# Patient Record
Sex: Female | Born: 1985 | Race: White | Hispanic: No | Marital: Married | Smoking: Current every day smoker
Health system: Southern US, Community
[De-identification: ages and names within clinical notes are randomized; demographics above are authoritative.]

## PROBLEM LIST (undated history)

## (undated) ENCOUNTER — Inpatient Hospital Stay (HOSPITAL_COMMUNITY): Payer: Self-pay

## (undated) DIAGNOSIS — F1910 Other psychoactive substance abuse, uncomplicated: Secondary | ICD-10-CM

## (undated) DIAGNOSIS — F32A Depression, unspecified: Secondary | ICD-10-CM

## (undated) DIAGNOSIS — F419 Anxiety disorder, unspecified: Secondary | ICD-10-CM

## (undated) HISTORY — DX: Other psychoactive substance abuse, uncomplicated: F19.10

## (undated) HISTORY — DX: Depression, unspecified: F32.A

## (undated) HISTORY — DX: Anxiety disorder, unspecified: F41.9

---
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
## 2017-11-27 ENCOUNTER — Encounter (HOSPITAL_COMMUNITY): Payer: Self-pay | Admitting: Emergency Medicine

## 2017-11-27 ENCOUNTER — Emergency Department (HOSPITAL_COMMUNITY)
Admission: EM | Admit: 2017-11-27 | Discharge: 2017-11-27 | Disposition: A | Discharge status: Home / Self Care | Payer: Self-pay | Attending: Emergency Medicine | Admitting: Emergency Medicine

## 2017-11-27 ENCOUNTER — Other Ambulatory Visit: Payer: Self-pay

## 2017-11-27 DIAGNOSIS — Y929 Unspecified place or not applicable: Secondary | ICD-10-CM | POA: Insufficient documentation

## 2017-11-27 DIAGNOSIS — Y999 Unspecified external cause status: Secondary | ICD-10-CM | POA: Insufficient documentation

## 2017-11-27 DIAGNOSIS — F17210 Nicotine dependence, cigarettes, uncomplicated: Secondary | ICD-10-CM | POA: Insufficient documentation

## 2017-11-27 DIAGNOSIS — X58XXXA Exposure to other specified factors, initial encounter: Secondary | ICD-10-CM | POA: Insufficient documentation

## 2017-11-27 DIAGNOSIS — S29011D Strain of muscle and tendon of front wall of thorax, subsequent encounter: Secondary | ICD-10-CM

## 2017-11-27 DIAGNOSIS — Y939 Activity, unspecified: Secondary | ICD-10-CM | POA: Insufficient documentation

## 2017-11-27 DIAGNOSIS — S29011A Strain of muscle and tendon of front wall of thorax, initial encounter: Secondary | ICD-10-CM | POA: Insufficient documentation

## 2017-11-27 MED ORDER — ACETAMINOPHEN 500 MG PO TABS: 500.0000 mg | ORAL_TABLET | Freq: Four times a day (QID) | ORAL | 0 refills | Status: DC | PRN

## 2017-11-27 MED ORDER — METHOCARBAMOL 500 MG PO TABS: 500.0000 mg | ORAL_TABLET | Freq: Every evening | ORAL | 0 refills | Status: DC | PRN

## 2017-11-27 MED ORDER — IBUPROFEN 600 MG PO TABS: 600.0000 mg | ORAL_TABLET | Freq: Four times a day (QID) | ORAL | 0 refills | Status: DC | PRN

## 2017-11-27 NOTE — ED Notes (Signed)
Respiratory called

## 2017-11-27 NOTE — ED Provider Notes (Signed)
[facility] EMERGENCY DEPARTMENT Provider Note   CSN: [id number] Arrival date & time: 11/27/17  1601     History   Chief Complaint Chief Complaint  Patient presents with  . Rib cage pain    HPI Lisa Pierce is a 32 y.o. female presenting with 3 weeks of left rib pain.  Denies any direct injury or trauma but reports coughing forcefully and starting to experience the pain thereafter.  The pain is worse with deep breaths and tender to the touch. She has tried ibuprofen without relief. Denies rash, cough, fever, chills or other symptoms.  No history of DVT/PE, prolonged immobilization, recent travel, lower extremity edema or pain, estrogen use, malignancy. No chest pain otherwise, shortness of breath.  HPI  History reviewed. No pertinent past medical history.  There are no active problems to display for this patient.   History reviewed. No pertinent surgical history.   OB History   None      Home Medications    Prior to Admission medications   Medication Sig Start Date End Date Taking? Authorizing Provider  acetaminophen (TYLENOL) 500 MG tablet Take 1 tablet (500 mg total) by mouth every 6 (six) hours as needed for mild pain or moderate pain. 11/27/17   Georgiana Shore, PA-C  ibuprofen (ADVIL,MOTRIN) 600 MG tablet Take 1 tablet (600 mg total) by mouth every 6 (six) hours as needed. 11/27/17   Georgiana Shore, PA-C  methocarbamol (ROBAXIN) 500 MG tablet Take 1 tablet (500 mg total) by mouth at bedtime as needed. 11/27/17   Georgiana Shore, PA-C    Family History No family history on file.  Social History Social History   Tobacco Use  . Smoking status: Current Every Day Smoker    Packs/day: 0.50    Types: Cigarettes  . Smokeless tobacco: Never Used  Substance Use Topics  . Alcohol use: Not Currently    Frequency: Never  . Drug use: Never     Allergies   Patient has no known allergies.   Review of Systems Review of Systems  Constitutional:  Negative for chills, fatigue and fever.  HENT: Negative for congestion.   Respiratory: Negative for cough, choking, chest tightness, shortness of breath, wheezing and stridor.   Cardiovascular: Negative for chest pain, palpitations and leg swelling.       Chest wall tenderness on the left side  Gastrointestinal: Negative for abdominal pain, nausea and vomiting.  Musculoskeletal: Negative for myalgias, neck pain and neck stiffness.  Skin: Negative for color change, pallor and rash.  Neurological: Negative for dizziness, weakness, light-headedness, numbness and headaches.     Physical Exam Updated Vital Signs BP 128/79 (BP Location: Right Arm)   Pulse (!) 55   Temp 98.3 F (36.8 C) (Oral)   Resp 18   Ht  (1.676 m)   Wt 54 kg (119 lb)   LMP  (LMP Unknown)   SpO2 100%   BMI 19.21 kg/m   Physical Exam  Constitutional: She appears well-developed and well-nourished. No distress.  Well-appearing, afebrile nontoxic sitting comfortably in bed no acute distress.  HENT:  Head: Normocephalic and atraumatic.  Eyes: Conjunctivae and EOM are normal. Right eye exhibits no discharge. Left eye exhibits no discharge.  Neck: Normal range of motion. Neck supple.  Cardiovascular: Normal rate, regular rhythm, normal heart sounds and intact distal pulses.  No murmur heard. Pulmonary/Chest: Effort normal and breath sounds normal. No stridor. No respiratory distress. She has no wheezes. She has no rales.  She exhibits tenderness.  Abdominal: She exhibits no distension.  Musculoskeletal: Normal range of motion. She exhibits no edema, tenderness or deformity.  Neurological: She is alert.  Skin: Skin is warm and dry. No rash noted. She is not diaphoretic. No erythema. No pallor.  Psychiatric: She has a normal mood and affect.  Nursing note and vitals reviewed.    ED Treatments / Results  Labs (all labs ordered are listed, but only abnormal results are displayed) Labs Reviewed - No data to  display  EKG None  Radiology No results found.  Procedures Procedures (including critical care time)  Medications Ordered in ED Medications - No data to display   Initial Impression / Assessment and Plan / ED Course  I have reviewed the triage vital signs and the nursing notes.  Pertinent labs & imaging results that were available during my care of the patient were reviewed by me and considered in my medical decision making (see chart for details).     Otherwise healthy 32 year old female presents with 3 weeks of left rib pain after forceful coughing 3 weeks ago.  Patient is without cough, fever or other symptoms.  Negative chest xray at UC earlier today. Tender to palpation along the left anterior 6th rib and 5-6th intercostal.  Low suspicion for PE as etiology of patient's symptoms. PERC negative. O2 sats 100% on room air, HR55, no SOB.  Patient's history and physical exam findings consistent with musculoskeletal etiology. Will treat symptomatically with anti-inflammatories and muscle relaxer.  She is well-appearing nontoxic afebrile. Discharge home with symptomatic relief and close follow-up with PCP.  Discussed strict return precautions and patient understood and agreed with plan.  Final Clinical Impressions(s) / ED Diagnoses   Final diagnoses:  Intercostal muscle strain, subsequent encounter    ED Discharge Orders        Ordered    methocarbamol (ROBAXIN) 500 MG tablet  At bedtime PRN     11/27/17 1727    ibuprofen (ADVIL,MOTRIN) 600 MG tablet  Every 6 hours PRN     11/27/17 1727    acetaminophen (TYLENOL) 500 MG tablet  Every 6 hours PRN     11/27/17 1727       Gregary Cromer 11/27/17 1811    Eber Hong, MD 11/29/17 [phone]

## 2017-11-27 NOTE — ED Notes (Signed)
resp in to assess

## 2017-11-27 NOTE — ED Triage Notes (Addendum)
Pt c/o cough and pain to LT rib cage x 1 month. States she was evaluated at UC x 2 including today. Per pt, xray was negative for any fracture or pneumonia.

## 2017-11-27 NOTE — Discharge Instructions (Signed)
Discussed, make sure that you continue to take full breaths, alternate between ibuprofen every 6 hours and Tylenol every 6 hours.  The medication prescribed can help with muscle spasm but should not be taken if working, with alcohol or driving.  Take at nighttime or every 8 hours as needed. Continue with topical muscle creams.  Follow-up with your primary care provider if symptoms persist beyond the week. Return sooner if symptoms worsen, chest pain, difficulty breathing, fever, chills or other new concerning symptoms in the meantime.

## 2020-02-13 LAB — HM PAP SMEAR: HM Pap smear: HIGH

## 2020-11-10 ENCOUNTER — Emergency Department (HOSPITAL_COMMUNITY)
Admission: EM | Admit: 2020-11-10 | Discharge: 2020-11-10 | Discharge status: Against Medical Advice | Payer: 59 | Attending: Emergency Medicine | Admitting: Emergency Medicine

## 2020-11-10 ENCOUNTER — Other Ambulatory Visit: Payer: Self-pay

## 2020-11-10 ENCOUNTER — Encounter (HOSPITAL_COMMUNITY): Payer: Self-pay | Admitting: Emergency Medicine

## 2020-11-10 DIAGNOSIS — R1013 Epigastric pain: Secondary | ICD-10-CM | POA: Diagnosis present

## 2020-11-10 DIAGNOSIS — R112 Nausea with vomiting, unspecified: Secondary | ICD-10-CM | POA: Insufficient documentation

## 2020-11-10 DIAGNOSIS — F17210 Nicotine dependence, cigarettes, uncomplicated: Secondary | ICD-10-CM | POA: Insufficient documentation

## 2020-11-10 DIAGNOSIS — Z3202 Encounter for pregnancy test, result negative: Secondary | ICD-10-CM | POA: Insufficient documentation

## 2020-11-10 LAB — CBC WITH DIFFERENTIAL/PLATELET
Abs Immature Granulocytes: 0.01 10*3/uL (ref 0.00–0.07)
Basophils Absolute: 0 10*3/uL (ref 0.0–0.1)
Basophils Relative: 0 %
Eosinophils Absolute: 0.1 10*3/uL (ref 0.0–0.5)
Eosinophils Relative: 1 %
HCT: 39 % (ref 36.0–46.0)
Hemoglobin: 13.2 g/dL (ref 12.0–15.0)
Immature Granulocytes: 0 %
Lymphocytes Relative: 19 %
Lymphs Abs: 1.1 10*3/uL (ref 0.7–4.0)
MCH: 32.5 pg (ref 26.0–34.0)
MCHC: 33.8 g/dL (ref 30.0–36.0)
MCV: 96.1 fL (ref 80.0–100.0)
Monocytes Absolute: 0.5 10*3/uL (ref 0.1–1.0)
Monocytes Relative: 9 %
Neutro Abs: 4 10*3/uL (ref 1.7–7.7)
Neutrophils Relative %: 71 %
Platelets: 322 10*3/uL (ref 150–400)
RBC: 4.06 MIL/uL (ref 3.87–5.11)
RDW: 12 % (ref 11.5–15.5)
WBC: 5.7 10*3/uL (ref 4.0–10.5)
nRBC: 0 % (ref 0.0–0.2)

## 2020-11-10 LAB — URINALYSIS, ROUTINE W REFLEX MICROSCOPIC
Bilirubin Urine: NEGATIVE
Glucose, UA: NEGATIVE mg/dL
Hgb urine dipstick: NEGATIVE
Ketones, ur: NEGATIVE mg/dL
Leukocytes,Ua: NEGATIVE
Nitrite: NEGATIVE
Protein, ur: NEGATIVE mg/dL
Specific Gravity, Urine: 1.015 (ref 1.005–1.030)
pH: 6.5 (ref 5.0–8.0)

## 2020-11-10 LAB — RAPID URINE DRUG SCREEN, HOSP PERFORMED
Amphetamines: NOT DETECTED
Barbiturates: NOT DETECTED
Benzodiazepines: NOT DETECTED
Cocaine: NOT DETECTED
Opiates: NOT DETECTED
Tetrahydrocannabinol: POSITIVE — AB

## 2020-11-10 LAB — COMPREHENSIVE METABOLIC PANEL
ALT: 15 U/L (ref 0–44)
AST: 22 U/L (ref 15–41)
Albumin: 3.9 g/dL (ref 3.5–5.0)
Alkaline Phosphatase: 45 U/L (ref 38–126)
Anion gap: 8 (ref 5–15)
BUN: 14 mg/dL (ref 6–20)
CO2: 24 mmol/L (ref 22–32)
Calcium: 8.7 mg/dL — ABNORMAL LOW (ref 8.9–10.3)
Chloride: 106 mmol/L (ref 98–111)
Creatinine, Ser: 0.71 mg/dL (ref 0.44–1.00)
GFR, Estimated: >60 mL/min (ref >60)
Glucose, Bld: 96 mg/dL (ref 70–99)
Potassium: 4.2 mmol/L (ref 3.5–5.1)
Sodium: 138 mmol/L (ref 135–145)
Total Bilirubin: 0.5 mg/dL (ref 0.3–1.2)
Total Protein: 6.6 g/dL (ref 6.5–8.1)

## 2020-11-10 LAB — PREGNANCY, URINE: Preg Test, Ur: NEGATIVE

## 2020-11-10 LAB — LIPASE, BLOOD: Lipase: 40 U/L (ref 11–51)

## 2020-11-10 MED ORDER — SODIUM CHLORIDE 0.9 % IV BOLUS
1000.0000 mL | Freq: Once | INTRAVENOUS | Status: AC
  Administered 2020-11-10: 1000 mL via INTRAVENOUS

## 2020-11-10 MED ORDER — ONDANSETRON HCL 4 MG/2ML IJ SOLN
4.0000 mg | Freq: Once | INTRAMUSCULAR | Status: AC
  Administered 2020-11-10: 4 mg via INTRAVENOUS
  Filled 2020-11-10: qty 2

## 2020-11-10 MED ORDER — FAMOTIDINE IN NACL 20-0.9 MG/50ML-% IV SOLN
20.0000 mg | Freq: Once | INTRAVENOUS | Status: AC
  Administered 2020-11-10: 20 mg via INTRAVENOUS
  Filled 2020-11-10: qty 50

## 2020-11-10 MED ORDER — KETOROLAC TROMETHAMINE 30 MG/ML IJ SOLN
30.0000 mg | Freq: Once | INTRAMUSCULAR | Status: AC
  Administered 2020-11-10: 30 mg via INTRAVENOUS
  Filled 2020-11-10: qty 1

## 2020-11-10 NOTE — TOC Progression Note (Signed)
Transition of Care [facility]) - Progression Note    Patient Details  Name: Lisa Pierce MRN: [id number] Date of Birth: [birth year]  Transition of Care [facility]) CM/SW Contact  Karn Cassis, [place] Phone Number: 11/10/2020, 1:24 PM  Clinical Narrative:  TOC notified that pt requesting substance abuse resources. Attempted to take resources to pt, but pt had already left ED. LCSW left voicemail for pt requesting return call to discuss.            Expected Discharge Plan and Services                                                 Social Determinants of Health (SDOH) Interventions    Readmission Risk Interventions No flowsheet data found.

## 2020-11-10 NOTE — ED Provider Notes (Signed)
[facility] EMERGENCY DEPARTMENT Provider Note   CSN: [id number] Arrival date & time: 11/10/20  0654     History Chief Complaint  Patient presents with  . Abdominal Pain    IVORIE UPLINGER is a 35 y.o. female.  HPI   35 year old female presents the emergency department concern for epigastric abdominal pain with n/v.  Patient states this began without past couple days.  Patient admits that she drinks about a gallon of whiskey every 3 to 4 days, she is been doing this for the past year.  She has never tried to detox or had withdrawal before.  She states she is had symptoms similar to present in the past but it is never lasted this long or been this severe.  No history of surgeries in the abdomen, no previous problems with her gallbladder or pancreas. She did have blood streaks in her last emesis, no clots. She has a Mirena in place and does not believe that she could be pregnant.  Denies any fever, diarrhea, genitourinary symptoms.  History reviewed. No pertinent past medical history.  There are no problems to display for this patient.   History reviewed. No pertinent surgical history.   OB History   No obstetric history on file.     History reviewed. No pertinent family history.  Social History   Tobacco Use  . Smoking status: Current Every Day Smoker    Packs/day: 0.50    Types: Cigarettes  . Smokeless tobacco: Never Used  Vaping Use  . Vaping Use: Never used  Substance Use Topics  . Alcohol use: Yes    Comment: 1 gallon of whiskey ever 3-4 days  . Drug use: Never    Home Medications Prior to Admission medications   Medication Sig Start Date End Date Taking? Authorizing Provider  acetaminophen (TYLENOL) 500 MG tablet Take 1 tablet (500 mg total) by mouth every 6 (six) hours as needed for mild pain or moderate pain. Patient not taking: Reported on 11/10/2020 11/27/17   Georgiana Shore, PA-C  ibuprofen (ADVIL,MOTRIN) 600 MG tablet Take 1 tablet (600 mg total) by  mouth every 6 (six) hours as needed. Patient not taking: Reported on 11/10/2020 11/27/17   Georgiana Shore, PA-C  methocarbamol (ROBAXIN) 500 MG tablet Take 1 tablet (500 mg total) by mouth at bedtime as needed. Patient not taking: Reported on 11/10/2020 11/27/17   Georgiana Shore, PA-C    Allergies    Patient has no known allergies.  Review of Systems   Review of Systems  Constitutional: Negative for chills and fever.  HENT: Negative for congestion.   Eyes: Negative for visual disturbance.  Respiratory: Negative for shortness of breath.   Cardiovascular: Negative for chest pain.  Gastrointestinal: Positive for abdominal pain, nausea and vomiting. Negative for diarrhea.  Genitourinary: Negative for dysuria.  Skin: Negative for rash.  Neurological: Negative for headaches.  Psychiatric/Behavioral: Negative for self-injury and suicidal ideas. The patient is nervous/anxious.     Physical Exam Updated Vital Signs BP (!) 131/98   Pulse 80   Temp 98.2 F (36.8 C) (Oral)   Resp 18   Ht 5\' 6"  (1.676 m)   Wt 67.6 kg   SpO2 98%   BMI 24.05 kg/m   Physical Exam Vitals and nursing note reviewed.  Constitutional:      Appearance: Normal appearance.  HENT:     Head: Normocephalic.     Mouth/Throat:     Mouth: Mucous membranes are moist.  Cardiovascular:  Rate and Rhythm: Normal rate.  Pulmonary:     Effort: Pulmonary effort is normal. No respiratory distress.  Abdominal:     Palpations: Abdomen is soft.     Tenderness: There is abdominal tenderness in the epigastric area. There is no guarding or rebound.  Skin:    General: Skin is warm.  Neurological:     Mental Status: She is alert and oriented to person, place, and time. Mental status is at baseline.  Psychiatric:        Mood and Affect: Mood is anxious.     ED Results / Procedures / Treatments   Labs (all labs ordered are listed, but only abnormal results are displayed) Labs Reviewed  CBC WITH  DIFFERENTIAL/PLATELET  COMPREHENSIVE METABOLIC PANEL  LIPASE, BLOOD  PREGNANCY, URINE  URINALYSIS, ROUTINE W REFLEX MICROSCOPIC  RAPID URINE DRUG SCREEN, HOSP PERFORMED    EKG None  Radiology No results found.  Procedures Procedures   Medications Ordered in ED Medications  famotidine (PEPCID) IVPB 20 mg premix (20 mg Intravenous New Bag/Given 11/10/20 0834)  sodium chloride 0.9 % bolus 1,000 mL (1,000 mLs Intravenous New Bag/Given 11/10/20 0827)  ondansetron (ZOFRAN) injection 4 mg (4 mg Intravenous Given 11/10/20 0829)  ketorolac (TORADOL) 30 MG/ML injection 30 mg (30 mg Intravenous Given 11/10/20 0830)    ED Course  I have reviewed the triage vital signs and the nursing notes.  Pertinent labs & imaging results that were available during my care of the patient were reviewed by me and considered in my medical decision making (see chart for details).    MDM Rules/Calculators/A&P                          Vitals are stable.  Lab evaluation is normal, after medication patient states her symptoms are significantly improved, she been able to eat and drink.  I put in a consult with social work to speak to the patient about her substance abuse and options for detox/outpatient care and follow-up.  However the patient eloped from the emergency department before social work can provide this information to her.  Report from nursing is that she said she had to go home to take care of her kids and left with a steady gait.  On my last evaluation she was comfortable in bed, no acute distress, abdominal exam was benign, vitals were normal.  Final Clinical Impression(s) / ED Diagnoses Final diagnoses:  None    Rx / DC Orders ED Discharge Orders    None       Rozelle Logan, DO 11/10/20 1415

## 2020-11-10 NOTE — ED Triage Notes (Signed)
Pt c/o abd pain and vomiting blood that started x3 days ago. Pt states she drinks 1 gallon whiskey every 3-4 days.

## 2020-11-10 NOTE — ED Notes (Signed)
Pt left the dept.  Pt states she has kids at home.  Pt states she will get PCP soon and will get help. Pt left with with visitor and with steady gait.

## 2021-01-05 ENCOUNTER — Ambulatory Visit: Payer: 59 | Admitting: Nurse Practitioner

## 2021-01-05 ENCOUNTER — Other Ambulatory Visit: Payer: Self-pay

## 2021-01-05 ENCOUNTER — Encounter: Payer: Self-pay | Admitting: Nurse Practitioner

## 2021-01-05 VITALS — BP 135/88 | HR 85 | Temp 97.1°F | Ht 66.0 in | Wt 155.0 lb

## 2021-01-05 DIAGNOSIS — F10230 Alcohol dependence with withdrawal, uncomplicated: Secondary | ICD-10-CM | POA: Diagnosis not present

## 2021-01-05 DIAGNOSIS — F10930 Alcohol use, unspecified with withdrawal, uncomplicated: Secondary | ICD-10-CM | POA: Insufficient documentation

## 2021-01-05 MED ORDER — GABAPENTIN 300 MG PO CAPS: 300.0000 mg | ORAL_CAPSULE | Freq: Three times a day (TID) | ORAL | 0 refills | Status: DC

## 2021-01-05 NOTE — Patient Instructions (Addendum)
Gabapentin 300 mg 3 times a 3 times daily for 3 days, then 300 mg twice a day for 1 day.  Alcohol Withdrawal Syndrome When a person who drinks a lot of alcohol stops drinking, he or she may have unpleasant and serious symptoms. These symptoms are called alcohol withdrawal syndrome. This condition may be mild or severe. It can be life-threatening. It can cause:  Shaking that you cannot control (tremor).  Sweating.  Headache.  Feeling fearful, upset, grouchy, or depressed.  Trouble sleeping (insomnia).  Nightmares.  Fast or uneven heartbeats (palpitations).  Alcohol cravings.  Feeling sick to your stomach (nausea).  Throwing up (vomiting).  Being bothered by light and sounds.  Confusion.  Trouble thinking clearly.  Not being hungry (loss of appetite).  Big changes in mood (mood swings). If you have all of the following symptoms at the same time, get help right away:  High blood pressure.  Fast heartbeat.  Trouble breathing.  Seizures.  Seeing, hearing, feeling, smelling, or tasting things that are not there (hallucinations). These symptoms are known as delirium tremens (DTs). They must be treated at the hospital right away. Follow these instructions at home:  Take over-the-counter and prescription medicines only as told by your doctor. This includes vitamins.  Do not drink alcohol.  Do not drive until your doctor says that this is safe for you.  Have someone stay with you or be available in case you need help. This should be someone you trust. This person can help you with your symptoms. He or she can also help you to not drink.  Drink enough fluid to keep your pee (urine) pale yellow.  Think about joining a support group or a treatment program to help you stop drinking.  Keep all follow-up visits as told by your doctor. This is important.   Contact a doctor if:  Your symptoms get worse.  You cannot eat or drink without throwing up.  You have a hard  time not drinking alcohol.  You cannot stop drinking alcohol. Get help right away if:  You have fast or uneven heartbeats.  You have chest pain.  You have trouble breathing.  You have a seizure for the first time.  You see, hear, feel, smell, or taste something that is not there.  You get very confused. Summary  When a person who drinks a lot of alcohol stops drinking, he or she may have serious symptoms. This is called alcohol withdrawal syndrome.  Delirium tremens (DTs) is a group of life-threatening symptoms. You should get help right away if you have these symptoms.  Think about joining an alcohol support group or a treatment program. This information is not intended to replace advice given to you by your health care provider. Make sure you discuss any questions you have with your health care provider. Document Revised: 07/01/2017 Document Reviewed: 03/25/2017 Elsevier Patient Education  2021 [employer].

## 2021-01-05 NOTE — Assessment & Plan Note (Addendum)
Started patient on gabapentin 300 mg tablet by mouth 3 times daily for 3 days, 300 mg tablet by mouth twice daily for 1 day.  I provided patient with resources to outpatient rehab and detox resources. Encourage patient to follow-up with worsening unresolved symptoms.  RX sent to pharmacy.  Printed handouts given.

## 2021-01-05 NOTE — Progress Notes (Addendum)
New Patient Note  RE: Lisa Pierce MRN: [id number] DOB: 1985/10/01 Date of Office Visit: 01/05/2021  Chief Complaint: Establish Care, Anxiety, and Alcohol Intoxication  History of Present Illness:  Patient is a 35 year old female who presents to clinic today for alcohol withdrawal.  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  She has tried once to quit and experienced terrible withdrawal symptoms.  Patient's last drink was on the Friday and today she is in to get some help with managing any type of withdrawal symptoms and to get help.   Patient is also presenting with anxiety in the last few months.  Symptoms are not new for patient.  She is not currently on any medication to treat anxiety.  Patient will hold off until she is completely done with treatments for alcohol withdrawals and consider anti-anxiety medication.    Assessment and Plan: Lisa Pierce is a 35 y.o. 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  I provided patient with resources to outpatient rehab and detox resources. Encourage patient to follow-up with worsening unresolved symptoms.  RX sent to pharmacy.  Printed handouts given.  Return if symptoms worsen or fail to improve.   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 No pertinent past medical history. Past Surgical History: History reviewed. No pertinent surgical history. Medication List:  Current Outpatient Medications  Medication Sig Dispense Refill   levonorgestrel (MIRENA) 20 MCG/DAY IUD 1 each by Intrauterine route once.     gabapentin (NEURONTIN) 300 MG capsule Take 1 capsule (300 mg total) by mouth 3 (three) times daily. Take 1  capsule by mouth 3 times daily for 3 days then 1 capsule by mouth twice daily for 1 day. 30 capsule 0   No current facility-administered medications for this visit.   Allergies: No Known Allergies Social History: Social History   Socioeconomic History   Marital status: Married    Spouse name: Not on file   Number of children: Not on file   Years of education: Not on file   Highest education level: Not on file  Occupational History   Not on file  Tobacco Use   Smoking status: Every Day    Packs/day: 0.50    Pack years: 0.00    Types: Cigarettes   Smokeless tobacco: Never  Vaping Use   Vaping Use: Never used  Substance and Sexual Activity   Alcohol use: Yes    Comment: 1 gallon of whiskey ever 3-4 days   Drug use: Yes    Types: Marijuana   Sexual activity: Yes    Birth control/protection: I.U.D.  Other Topics Concern   Not on file  Social History Narrative   Not on file   Social Determinants of Health   Financial Resource Strain: Not on file  Food Insecurity: Not on file  Transportation Needs: Not on file  Physical Activity: Not on file  Stress: Not on file  Social Connections: Not on file       Family History: History reviewed. No pertinent family history.       Review of Systems  Constitutional: Negative.   HENT: Negative.    Respiratory: Negative.    Cardiovascular: Negative.   Gastrointestinal: Negative.  Genitourinary: Negative.   Skin:  Negative for rash.  Psychiatric/Behavioral:  The patient is nervous/anxious.   All other systems reviewed and are negative. Objective: BP 135/88   Pulse 85   Temp (!) 97.1 F (36.2 C) (Temporal)   Ht 5\' 6"  (1.676 m)   Wt 155 lb (70.3 kg)   SpO2 97%   BMI 25.02 kg/m  Body mass index is 25.02 kg/m. Physical Exam Nursing note reviewed.  Constitutional:      Appearance: Normal appearance.  HENT:     Head: Normocephalic.     Nose: Nose normal.  Eyes:     Conjunctiva/sclera: Conjunctivae normal.   Cardiovascular:     Pulses: Normal pulses.     Heart sounds: Normal heart sounds.  Pulmonary:     Effort: Pulmonary effort is normal.     Breath sounds: Normal breath sounds.  Abdominal:     General: Bowel sounds are normal.  Musculoskeletal:        General: Normal range of motion.  Skin:    General: Skin is warm.     Findings: No rash.  Neurological:     Mental Status: She is alert and oriented to person, place, and time.  Psychiatric:        Mood and Affect: Mood is anxious.        Behavior: Behavior normal. Behavior is cooperative.        Thought Content: Thought content does not include suicidal ideation. Thought content does not include suicidal plan.   The plan was reviewed with the patient/family, and all questions/concerned were addressed.  It was my pleasure to see Lisa Pierce today and participate in her care. Please feel free to contact me with any questions or concerns.  Sincerely,  Dahlia Client NP [facility]

## 2021-01-06 ENCOUNTER — Encounter: Payer: Self-pay | Admitting: Nurse Practitioner

## 2021-01-14 ENCOUNTER — Telehealth: Payer: Self-pay | Admitting: Nurse Practitioner

## 2021-01-14 DIAGNOSIS — F10930 Alcohol use, unspecified with withdrawal, uncomplicated: Secondary | ICD-10-CM

## 2021-01-15 NOTE — Telephone Encounter (Signed)
  Prescription Request  01/15/2021  What is the name of the medication or equipment? Gabapentin  Have you contacted your pharmacy to request a refill? (if applicable) no  Which pharmacy would you like this sent to? [facility]   Patient notified that their request is being sent to the clinical staff for review and that they should receive a response within 2 business days.    [name]'s pt.  She wants to continue meds, so please call in.

## 2021-01-16 MED ORDER — GABAPENTIN 300 MG PO CAPS: 300.0000 mg | ORAL_CAPSULE | Freq: Three times a day (TID) | ORAL | 0 refills | Status: DC

## 2021-01-16 NOTE — Telephone Encounter (Signed)
Appt scheduled with Lisa Pierce for the migraines. Pt states she will run out of gabapentin today and it is the weekend she is afraid she will drink with out it. Can we refill it? Covering pcp.

## 2021-01-16 NOTE — Addendum Note (Signed)
Addended by: Ignacia Bayley on: 01/16/2021 09:02 AM   Modules accepted: Orders

## 2021-01-16 NOTE — Telephone Encounter (Signed)
Patient aware and verbalized understanding. °

## 2021-01-16 NOTE — Telephone Encounter (Signed)
Ok to refill for 30 days  

## 2021-01-19 ENCOUNTER — Ambulatory Visit (INDEPENDENT_AMBULATORY_CARE_PROVIDER_SITE_OTHER): Payer: 59 | Admitting: Nurse Practitioner

## 2021-01-19 ENCOUNTER — Encounter: Payer: Self-pay | Admitting: Nurse Practitioner

## 2021-01-19 DIAGNOSIS — G43009 Migraine without aura, not intractable, without status migrainosus: Secondary | ICD-10-CM | POA: Diagnosis not present

## 2021-01-19 DIAGNOSIS — F10930 Alcohol use, unspecified with withdrawal, uncomplicated: Secondary | ICD-10-CM

## 2021-01-19 DIAGNOSIS — F10230 Alcohol dependence with withdrawal, uncomplicated: Secondary | ICD-10-CM | POA: Diagnosis not present

## 2021-01-19 NOTE — Progress Notes (Signed)
   Virtual Visit  Note Due to COVID-19 pandemic this visit was conducted virtually. This visit type was conducted due to national recommendations for restrictions regarding the COVID-19 Pandemic (e.g. social distancing, sheltering in place) in an effort to limit this patient's exposure and mitigate transmission in our community. All issues noted in this document were discussed and addressed.  A physical exam was not performed with this format.  I connected with Lisa Pierce on 01/19/21 at 8:15 AM by telephone and verified that I am speaking with the correct person using two identifiers. Lisa Pierce is currently located at home during visit. The provider, Daryll Drown, NP is located in their office at time of visit.  I discussed the limitations, risks, security and privacy concerns of performing an evaluation and management service by telephone and the availability of in person appointments. I also discussed with the patient that there may be a patient responsible charge related to this service. The patient expressed understanding and agreed to proceed.   History and Present Illness:  Migraine  This is a chronic problem. The current episode started more than 1 year ago. The problem occurs constantly. The problem has been gradually improving. The pain is located in the Bilateral region. The pain does not radiate. The pain quality is similar to prior headaches. The quality of the pain is described as aching and shooting. The pain is moderate. Pertinent negatives include no abdominal pain, abnormal behavior, anorexia, back pain, coughing, dizziness, fever, nausea or visual change. The treatment provided significant relief. Her past medical history is significant for migraine headaches.     Review of Systems  Constitutional:  Negative for fever.  Respiratory:  Negative for cough.   Gastrointestinal:  Negative for abdominal pain, anorexia and nausea.  Musculoskeletal:  Negative for back pain.   Neurological:  Negative for dizziness.  All other systems reviewed and are negative.   Observations/Objective: Televisit patient is in distress.  Assessment and Plan: Symptoms of migraine not well controlled.  Started patient on gabapentin 300 mg daily.  Patient used gabapentin in the past few weeks for alcohol withdrawal and reported good therapeutic effect for long-term migraine.  Advised patient to follow-up in few weeks.  Follow Up Instructions: Follow-up for worsening hours of symptoms.    I discussed the assessment and treatment plan with the patient. The patient was provided an opportunity to ask questions and all were answered. The patient agreed with the plan and demonstrated an understanding of the instructions.   The patient was advised to call back or seek an in-person evaluation if the symptoms worsen or if the condition fails to improve as anticipated.  The above assessment and management plan was discussed with the patient. The patient verbalized understanding of and has agreed to the management plan. Patient is aware to call the clinic if symptoms persist or worsen. Patient is aware when to return to the clinic for a follow-up visit. Patient educated on when it is appropriate to go to the emergency department.   Time call ended: 8:25 AM.  I provided am minutes of  non face-to-face time during this encounter.    Daryll Drown, NP

## 2021-01-19 NOTE — Assessment & Plan Note (Signed)
Symptoms of migraine not well controlled.  Started patient on gabapentin 300 mg daily.  Patient used gabapentin in the past few weeks for alcohol withdrawal and reported good therapeutic effect for long-term migraine.  Advised patient to follow-up in few weeks.

## 2021-03-03 ENCOUNTER — Ambulatory Visit (INDEPENDENT_AMBULATORY_CARE_PROVIDER_SITE_OTHER): Payer: 59 | Admitting: Nurse Practitioner

## 2021-03-03 ENCOUNTER — Encounter: Payer: Self-pay | Admitting: Nurse Practitioner

## 2021-03-03 VITALS — Temp 99.4°F

## 2021-03-03 DIAGNOSIS — Z20822 Contact with and (suspected) exposure to covid-19: Secondary | ICD-10-CM | POA: Diagnosis not present

## 2021-03-03 NOTE — Progress Notes (Signed)
   Virtual Visit  Note Due to COVID-19 pandemic this visit was conducted virtually. This visit type was conducted due to national recommendations for restrictions regarding the COVID-19 Pandemic (e.g. social distancing, sheltering in place) in an effort to limit this patient's exposure and mitigate transmission in our community. All issues noted in this document were discussed and addressed.  A physical exam was not performed with this format.  I connected with Lisa Pierce on 03/03/21 at 3:20 PM by telephone and verified that I am speaking with the correct person using two identifiers. Lisa Pierce is currently located at home during visit. The provider, Daryll Drown, NP is located in their office at time of visit.  I discussed the limitations, risks, security and privacy concerns of performing an evaluation and management service by telephone and the availability of in person appointments. I also discussed with the patient that there may be a patient responsible charge related to this service. The patient expressed understanding and agreed to proceed.   History and Present Illness:  HPI  Patient is a 35 year old female who is being assessed over telephone for symptoms of COVID-19.  In the last 3 days patient has had headache, vomiting, diarrhea, low-grade fever, body aches and sore throat.  She was exposed to her neighbor who was positive for COVID-19.  Patient is at home COVID-19 test negative.  Patient is coming to clinic for PCR test.    Review of Systems  Constitutional:  Positive for chills and fever.  HENT:  Positive for sore throat.   Respiratory: Negative.    Cardiovascular: Negative.   Gastrointestinal:  Positive for diarrhea, nausea and vomiting.  Skin:  Negative for rash.  Psychiatric/Behavioral: Negative.    All other systems reviewed and are negative.   Observations/Objective: Televisit patient not in distress.  Assessment and Plan:  Patient exposed to COVID-19  from a neighbor and says have had symptoms of headache, vomiting, low-grade fever, body aches and sore throat.  At home COVID test negative advised patient to come into clinic for COVID PCR.  Continue hydration, Tylenol for body aches and fever we will treat patient pending results.   Follow Up Instructions: Follow-up for worsening unresolved symptoms.    I discussed the assessment and treatment plan with the patient. The patient was provided an opportunity to ask questions and all were answered. The patient agreed with the plan and demonstrated an understanding of the instructions.   The patient was advised to call back or seek an in-person evaluation if the symptoms worsen or if the condition fails to improve as anticipated.  The above assessment and management plan was discussed with the patient. The patient verbalized understanding of and has agreed to the management plan. Patient is aware to call the clinic if symptoms persist or worsen. Patient is aware when to return to the clinic for a follow-up visit. Patient educated on when it is appropriate to go to the emergency department.   Time call ended: 3:30 PM  I provided 10 minutes of  non face-to-face time during this encounter.    Daryll Drown, NP

## 2021-03-03 NOTE — Assessment & Plan Note (Signed)
Patient exposed to COVID-19 from a neighbor and says have had symptoms of headache, vomiting, low-grade fever, body aches and sore throat.  At home COVID test negative advised patient to come into clinic for COVID PCR.  Continue hydration, Tylenol for body aches and fever we will treat patient pending results.

## 2021-03-04 LAB — SARS-COV-2, NAA 2 DAY TAT

## 2021-03-04 LAB — NOVEL CORONAVIRUS, NAA: SARS-CoV-2, NAA: NOT DETECTED

## 2021-03-05 ENCOUNTER — Encounter (HOSPITAL_COMMUNITY): Payer: Self-pay | Admitting: Emergency Medicine

## 2021-03-05 ENCOUNTER — Other Ambulatory Visit: Payer: Self-pay

## 2021-03-05 ENCOUNTER — Emergency Department (HOSPITAL_COMMUNITY)
Admission: EM | Admit: 2021-03-05 | Discharge: 2021-03-05 | Disposition: A | Discharge status: Home / Self Care | Payer: 59 | Attending: Emergency Medicine | Admitting: Emergency Medicine

## 2021-03-05 ENCOUNTER — Emergency Department (HOSPITAL_COMMUNITY): Payer: 59

## 2021-03-05 DIAGNOSIS — F17210 Nicotine dependence, cigarettes, uncomplicated: Secondary | ICD-10-CM | POA: Insufficient documentation

## 2021-03-05 DIAGNOSIS — R112 Nausea with vomiting, unspecified: Secondary | ICD-10-CM | POA: Diagnosis not present

## 2021-03-05 DIAGNOSIS — R1031 Right lower quadrant pain: Secondary | ICD-10-CM

## 2021-03-05 DIAGNOSIS — R197 Diarrhea, unspecified: Secondary | ICD-10-CM | POA: Insufficient documentation

## 2021-03-05 LAB — COMPREHENSIVE METABOLIC PANEL
ALT: 46 U/L — ABNORMAL HIGH (ref 0–44)
AST: 25 U/L (ref 15–41)
Albumin: 4.5 g/dL (ref 3.5–5.0)
Alkaline Phosphatase: 50 U/L (ref 38–126)
Anion gap: 7 (ref 5–15)
BUN: 20 mg/dL (ref 6–20)
CO2: 22 mmol/L (ref 22–32)
Calcium: 9 mg/dL (ref 8.9–10.3)
Chloride: 106 mmol/L (ref 98–111)
Creatinine, Ser: 0.82 mg/dL (ref 0.44–1.00)
GFR, Estimated: >60 mL/min (ref >60)
Glucose, Bld: 125 mg/dL — ABNORMAL HIGH (ref 70–99)
Potassium: 3.5 mmol/L (ref 3.5–5.1)
Sodium: 135 mmol/L (ref 135–145)
Total Bilirubin: 0.8 mg/dL (ref 0.3–1.2)
Total Protein: 7.2 g/dL (ref 6.5–8.1)

## 2021-03-05 LAB — CBC WITH DIFFERENTIAL/PLATELET
Abs Immature Granulocytes: 0.02 10*3/uL (ref 0.00–0.07)
Basophils Absolute: 0 10*3/uL (ref 0.0–0.1)
Basophils Relative: 1 %
Eosinophils Absolute: 0.1 10*3/uL (ref 0.0–0.5)
Eosinophils Relative: 2 %
HCT: 40.1 % (ref 36.0–46.0)
Hemoglobin: 13.6 g/dL (ref 12.0–15.0)
Immature Granulocytes: 0 %
Lymphocytes Relative: 21 %
Lymphs Abs: 1.2 10*3/uL (ref 0.7–4.0)
MCH: 31.6 pg (ref 26.0–34.0)
MCHC: 33.9 g/dL (ref 30.0–36.0)
MCV: 93 fL (ref 80.0–100.0)
Monocytes Absolute: 0.5 10*3/uL (ref 0.1–1.0)
Monocytes Relative: 9 %
Neutro Abs: 4 10*3/uL (ref 1.7–7.7)
Neutrophils Relative %: 67 %
Platelets: 365 10*3/uL (ref 150–400)
RBC: 4.31 MIL/uL (ref 3.87–5.11)
RDW: 11.9 % (ref 11.5–15.5)
WBC: 5.9 10*3/uL (ref 4.0–10.5)
nRBC: 0 % (ref 0.0–0.2)

## 2021-03-05 LAB — URINALYSIS, ROUTINE W REFLEX MICROSCOPIC
Bacteria, UA: NONE SEEN
Bilirubin Urine: NEGATIVE
Glucose, UA: NEGATIVE mg/dL
Ketones, ur: 20 mg/dL — AB
Leukocytes,Ua: NEGATIVE
Nitrite: NEGATIVE
Protein, ur: NEGATIVE mg/dL
Specific Gravity, Urine: 1.016 (ref 1.005–1.030)
pH: 6 (ref 5.0–8.0)

## 2021-03-05 LAB — POC URINE PREG, ED: Preg Test, Ur: NEGATIVE

## 2021-03-05 LAB — LIPASE, BLOOD: Lipase: 38 U/L (ref 11–51)

## 2021-03-05 LAB — POC OCCULT BLOOD, ED: Fecal Occult Bld: NEGATIVE

## 2021-03-05 MED ORDER — DIPHENOXYLATE-ATROPINE 2.5-0.025 MG PO TABS: 2.0000 | ORAL_TABLET | Freq: Once | ORAL | Status: DC

## 2021-03-05 MED ORDER — ONDANSETRON HCL 4 MG/2ML IJ SOLN
4.0000 mg | Freq: Once | INTRAMUSCULAR | Status: AC
  Administered 2021-03-05: 4 mg via INTRAVENOUS
  Filled 2021-03-05: qty 2

## 2021-03-05 MED ORDER — SODIUM CHLORIDE 0.9 % IV BOLUS
1000.0000 mL | Freq: Once | INTRAVENOUS | Status: AC
  Administered 2021-03-05: 1000 mL via INTRAVENOUS

## 2021-03-05 MED ORDER — DIPHENOXYLATE-ATROPINE 2.5-0.025 MG PO TABS: 1.0000 | ORAL_TABLET | Freq: Four times a day (QID) | ORAL | 0 refills | Status: DC | PRN

## 2021-03-05 MED ORDER — MORPHINE SULFATE (PF) 4 MG/ML IV SOLN
4.0000 mg | Freq: Once | INTRAVENOUS | Status: AC
  Administered 2021-03-05: 4 mg via INTRAVENOUS
  Filled 2021-03-05: qty 1

## 2021-03-05 MED ORDER — PANTOPRAZOLE SODIUM 40 MG IV SOLR
40.0000 mg | Freq: Once | INTRAVENOUS | Status: AC
  Administered 2021-03-05: 40 mg via INTRAVENOUS
  Filled 2021-03-05: qty 40

## 2021-03-05 MED ORDER — IOHEXOL 350 MG/ML SOLN
75.0000 mL | Freq: Once | INTRAVENOUS | Status: AC | PRN
  Administered 2021-03-05: 75 mL via INTRAVENOUS

## 2021-03-05 MED ORDER — ONDANSETRON 4 MG PO TBDP: 4.0000 mg | ORAL_TABLET | Freq: Three times a day (TID) | ORAL | 0 refills | Status: DC | PRN

## 2021-03-05 MED ORDER — HYDROMORPHONE HCL 1 MG/ML IJ SOLN
1.0000 mg | Freq: Once | INTRAMUSCULAR | Status: AC
Start: 2021-03-05 — End: 2021-03-05
  Administered 2021-03-05: 1 mg via INTRAVENOUS
  Filled 2021-03-05: qty 1

## 2021-03-05 NOTE — ED Provider Notes (Signed)
[facility] EMERGENCY DEPARTMENT Provider Note   CSN: [id number] Arrival date & time: 03/05/21  0940     History Chief Complaint  Patient presents with   Abdominal Pain    Lisa Pierce is a 35 y.o. female who is currently 3 days sober, presenting with a 6 day history of nausea, vomiting and lower abdominal pain which now is localizing to her right lower quadrant.  She reports 4-5 episodes of vomiting daily, had diarrhea several times daily which slowed up after taking pepto bismol 2 days ago, then had a very black but better formed stool this am.  She also endorses vomiting blood pink tinged mucous this am prior to arrival.  She endorses weakness and lightheadedness when trying to stand for more than a few minutes.  She saw her pcp late last week and was tested for Covid which was negative.  She denies cp, sob, uri type symptoms. She has had subjective fever.  She denies dysuria, reduced urination, has no vaginal dc, no risk factors for stds.  LMP about 10 years ago (Mirena).   The history is provided by the patient.      History reviewed. No pertinent past medical history.  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 No pertinent surgical history.   OB History   No obstetric history on file.     History reviewed. No pertinent family history.  Social History   Tobacco Use   Smoking status: Every Day    Packs/day: 0.50    Types: Cigarettes   Smokeless tobacco: Never  Vaping Use   Vaping Use: Never used  Substance Use Topics   Alcohol use: Yes    Comment: 1 gallon of whiskey ever 3-4 days/clean 60 days 8/4   Drug use: Yes    Frequency: 2.0 times per week    Types: Marijuana    Home Medications Prior to Admission medications   Medication Sig Start Date End Date Taking? Authorizing Provider  diphenoxylate-atropine  (LOMOTIL) 2.5-0.025 MG tablet Take 1 tablet by mouth 4 (four) times daily as needed for diarrhea or loose stools. 03/05/21  Yes See Beharry, Raynelle Fanning, PA-C  ondansetron (ZOFRAN ODT) 4 MG disintegrating tablet Take 1 tablet (4 mg total) by mouth every 8 (eight) hours as needed for nausea or vomiting. 03/05/21  Yes Tianne Plott, Raynelle Fanning, PA-C  gabapentin (NEURONTIN) 300 MG capsule Take 1 capsule (300 mg total) by mouth 3 (three) times daily. Take 1 capsule by mouth 3 times daily for 3 days then 1 capsule by mouth twice daily for 1 day. 01/16/21   Gabriel Earing, FNP  levonorgestrel (MIRENA) 20 MCG/DAY IUD 1 each by Intrauterine route once.    [provider]    Allergies    Patient has no known allergies.  Review of Systems   Review of Systems  Constitutional:  Positive for fatigue and fever.  HENT:  Negative for congestion, rhinorrhea and sore throat.   Eyes: Negative.   Respiratory:  Negative for chest tightness and shortness of breath.   Cardiovascular:  Negative for chest pain.  Gastrointestinal:  Positive for abdominal pain, diarrhea, nausea and vomiting.  Genitourinary: Negative.   Musculoskeletal:  Negative for arthralgias, joint swelling and neck pain.  Skin: Negative.  Negative for rash and wound.  Neurological:  Positive for light-headedness. Negative for dizziness, weakness, numbness and headaches.  Psychiatric/Behavioral: Negative.    All other systems reviewed and are negative.  Physical Exam Updated Vital Signs BP 115/81   Pulse 62   Temp 98.4 F (36.9 C) (Oral)   Resp 18   Ht 5\' 6"  (1.676 m)   Wt 68 kg   SpO2 99%   BMI 24.21 kg/m   Physical Exam Vitals and nursing note reviewed.  Constitutional:      Appearance: She is well-developed.  HENT:     Head: Normocephalic and atraumatic.  Eyes:     Conjunctiva/sclera: Conjunctivae normal.  Cardiovascular:     Rate and Rhythm: Normal rate and regular rhythm.     Heart sounds: Normal heart sounds.  Pulmonary:     Effort:  Pulmonary effort is normal.     Breath sounds: Normal breath sounds. No wheezing.  Abdominal:     General: Bowel sounds are normal.     Palpations: Abdomen is soft.     Tenderness: There is abdominal tenderness in the right lower quadrant and periumbilical area. There is guarding. There is no rebound. Negative signs include Murphy's sign and McBurney's sign.  Musculoskeletal:        General: Normal range of motion.     Cervical back: Normal range of motion.  Skin:    General: Skin is warm and dry.  Neurological:     Mental Status: She is alert.    ED Results / Procedures / Treatments   Labs (all labs ordered are listed, but only abnormal results are displayed) Labs Reviewed  COMPREHENSIVE METABOLIC PANEL - Abnormal; Notable for the following components:      Result Value   Glucose, Bld 125 (*)    ALT 46 (*)    All other components within normal limits  URINALYSIS, ROUTINE W REFLEX MICROSCOPIC - Abnormal; Notable for the following components:   Hgb urine dipstick SMALL (*)    Ketones, ur 20 (*)    All other components within normal limits  LIPASE, BLOOD  CBC WITH DIFFERENTIAL/PLATELET  POC URINE PREG, ED  POC OCCULT BLOOD, ED    EKG None  Radiology CT ABDOMEN PELVIS W CONTRAST  Result Date: 03/05/2021 CLINICAL DATA:  Right lower quadrant abdominal pain EXAM: CT ABDOMEN AND PELVIS WITH CONTRAST TECHNIQUE: Multidetector CT imaging of the abdomen and pelvis was performed using the standard protocol following bolus administration of intravenous contrast. CONTRAST:  48mL OMNIPAQUE IOHEXOL 350 MG/ML SOLN COMPARISON:  None. FINDINGS: Lower chest: The lung bases are clear. The imaged heart is unremarkable. Hepatobiliary: The liver is normal. The gallbladder is normal. There is no intra or extrahepatic biliary ductal dilatation. Pancreas: Normal. Spleen: Normal.  There is an accessory splenule. Adrenals/Urinary Tract: The adrenals are unremarkable. There is a subcentimeter hypodense  lesion in the right kidney lower pole, too small to characterize but likely a small cyst. The kidneys are otherwise unremarkable, with no hydronephrosis, enhancing lesion, or stone. There is no hydroureter. The bladder is unremarkable. Stomach/Bowel: The stomach is unremarkable. There is no evidence of bowel obstruction. There is no abnormal bowel thickening or inflammatory change. The appendix is not identified and there is no pericecal inflammatory change. Vascular/Lymphatic: No significant vascular findings are present. No enlarged abdominal or pelvic lymph nodes. Reproductive: There is a 1.8 cm peripherally enhancing lesion in the right adnexa likely reflecting corpus luteal cyst. The adnexa are otherwise unremarkable. An IUD is in place within the uterus. Other: There is a small fat containing umbilical hernia. No abdominopelvic ascites. Musculoskeletal: There  is no acute osseous abnormality. IMPRESSION: The appendix cannot be identified, but there is no pericecal inflammatory change. Otherwise unremarkable study with no finding to explain the patient's pain. Electronically Signed   By: Lesia Hausen MD   On: 03/05/2021 14:04   US PELVIC COMPLETE W TRANSVAGINAL AND TORSION R/O  Result Date: 03/05/2021 CLINICAL DATA:  Right lower quadrant pain EXAM: TRANSABDOMINAL AND TRANSVAGINAL ULTRASOUND OF PELVIS DOPPLER ULTRASOUND OF OVARIES TECHNIQUE: Both transabdominal and transvaginal ultrasound examinations of the pelvis were performed. Transabdominal technique was performed for global imaging of the pelvis including uterus, ovaries, adnexal regions, and pelvic cul-de-sac. It was necessary to proceed with endovaginal exam following the transabdominal exam to visualize the uterus endometrium ovaries. Color and duplex Doppler ultrasound was utilized to evaluate blood flow to the ovaries. COMPARISON:  CT 03/05/2021 FINDINGS: Uterus Measurements: 6.4 x 4.1 x 5.1 cm = volume: 69.8 mL. No fibroids or other mass  visualized. Endometrium Thickness: 4.6 mm.  IUD within the uterus Right ovary Measurements: 3.6 x 2.3 x 2.6 cm = volume: 11.7 mL. Normal appearance/no adnexal mass. Left ovary Measurements: 2 x 1.4 x 1.4 cm = volume: 2.1 mL. Normal appearance/no adnexal mass. Pulsed Doppler evaluation of both ovaries demonstrates normal low-resistance arterial and venous waveforms. Other findings Small free fluid IMPRESSION: 1. Negative for ovarian torsion. 2. IUD appears grossly appropriate in position by sonography 3. Small free fluid in the pelvis Electronically Signed   By: Jasmine Pang M.D.   On: 03/05/2021 16:51    Procedures Procedures   Medications Ordered in ED Medications  diphenoxylate-atropine (LOMOTIL) 2.5-0.025 MG per tablet 2 tablet (has no administration in time range)  sodium chloride 0.9 % bolus 1,000 mL (0 mLs Intravenous Stopped 03/05/21 1216)  ondansetron (ZOFRAN) injection 4 mg (4 mg Intravenous Given 03/05/21 1023)  morphine 4 MG/ML injection 4 mg (4 mg Intravenous Given 03/05/21 1023)  pantoprazole (PROTONIX) injection 40 mg (40 mg Intravenous Given 03/05/21 1254)  iohexol (OMNIPAQUE) 350 MG/ML injection 75 mL (75 mLs Intravenous Contrast Given 03/05/21 1313)  HYDROmorphone (DILAUDID) injection 1 mg (1 mg Intravenous Given 03/05/21 1347)    ED Course  I have reviewed the triage vital signs and the nursing notes.  Pertinent labs & imaging results that were available during my care of the patient were reviewed by me and considered in my medical decision making (see chart for details).    MDM Rules/Calculators/A&P                           Patient with right lower quadrant pain along with nausea and vomiting with diarrhea, labs, CT imaging completed and negative for acute findings.  There was concern for right adnexal lesion, therefore she was sent back for pelvic ultrasound.  This was negative for ovarian mass or lesion, no torsion.  Although her appendix was not identified on the CT imaging  there was no pericecal inflammatory changes.  With her nausea vomiting diarrhea, I favor gastroenteritis.  She has no evidence for obstruction or any other intra-abdominal process.  We did discuss strict return precautions or close follow-up for any new or persistent symptoms, development of fever or worsening pain or uncontrolled vomiting.  She was placed on Zofran for nausea, Lomotil for diarrhea and abdominal cramping.  Plan close follow-up with her PCP as needed.  Discussed with Dr. Madaline Guthrie prior to discharge home.  The patient appears reasonably screened and/or stabilized for discharge and I doubt any  other medical condition or other [facility] requiring further screening, evaluation, or treatment in the ED at this time prior to discharge.  Final Clinical Impression(s) / ED Diagnoses Final diagnoses:  Right lower quadrant abdominal pain  Nausea vomiting and diarrhea    Rx / DC Orders ED Discharge Orders          Ordered    ondansetron (ZOFRAN ODT) 4 MG disintegrating tablet  Every 8 hours PRN        03/05/21 1714    diphenoxylate-atropine (LOMOTIL) 2.5-0.025 MG tablet  4 times daily PRN        03/05/21 1714             Burgess Amordol, Evangaline Jou, PA-C 03/05/21 1918    Cathren LaineSteinl, Kevin, MD 03/16/21 1717

## 2021-03-05 NOTE — Discharge Instructions (Addendum)
Your lab test today are normal and reassuring.  Although your CT scan suggested a possible right ovarian cyst, your ultrasound confirmed that your right ovary is normal without evidence for a cyst or any other problems.  I suspect your symptoms may be from a viral gastroenteritis.  You may take the medicines prescribed for any persistent nausea, vomiting and diarrhea.  The diarrhea medicine called Lomotil also can help with intestinal cramping, however if you continue taking this medicine once your diarrhea is resolved it can cause severe constipation, therefore use sparingly.  Plan recheck by your primary doctor if your symptoms are not improving over the next several days.  Make sure you are drinking plenty of fluids to maintain hydration.

## 2021-03-05 NOTE — ED Triage Notes (Signed)
Pt to the ED with abdominal pain with N/V/D  that began on 02/28/21.  Pt states she started vomiting blood and had blood in her stool this morning.

## 2021-03-09 ENCOUNTER — Other Ambulatory Visit: Payer: Self-pay

## 2021-03-09 ENCOUNTER — Ambulatory Visit: Payer: 59 | Admitting: Nurse Practitioner

## 2021-03-09 ENCOUNTER — Encounter: Payer: Self-pay | Admitting: Nurse Practitioner

## 2021-03-09 VITALS — BP 114/77 | HR 62 | Temp 97.6°F | Ht 66.0 in | Wt 152.0 lb

## 2021-03-09 DIAGNOSIS — R110 Nausea: Secondary | ICD-10-CM | POA: Diagnosis not present

## 2021-03-09 DIAGNOSIS — F419 Anxiety disorder, unspecified: Secondary | ICD-10-CM | POA: Diagnosis not present

## 2021-03-09 DIAGNOSIS — R1031 Right lower quadrant pain: Secondary | ICD-10-CM | POA: Diagnosis not present

## 2021-03-09 DIAGNOSIS — R112 Nausea with vomiting, unspecified: Secondary | ICD-10-CM | POA: Insufficient documentation

## 2021-03-09 MED ORDER — IBUPROFEN 600 MG PO TABS: 600.0000 mg | ORAL_TABLET | Freq: Three times a day (TID) | ORAL | 0 refills | Status: DC | PRN

## 2021-03-09 NOTE — Assessment & Plan Note (Signed)
Improved abdominal pain symptoms, advised patient to avoid dairy product with worsening nausea and diarrhea, incorporate BRAT diet, increase hydration and follow up with worsening unresolved symptoms.

## 2021-03-09 NOTE — Patient Instructions (Signed)
Campylobacter Gastroenteritis Campylobacter gastroenteritis is a common infection that can cause diarrhea and other symptoms. It is caused by Campylobacter bacteria. These bacteria often infect animals, and the condition spreads easily to other animals and humans through food and water that contain the bacteria (contaminated food and water). Campylobacter gastroenteritis is also known as traveler's diarrhea, and it is more common in countries where food and water arecontaminated. In very rare cases, a campylobacter infection may lead to another condition called Guillain-Barr syndrome (GBS). This condition can occur when the body's disease-fighting (immune) system overreacts after an infection. Signs of GBS include weakness or inability to move (paralysis) that may last for weeks or years. What are the causes? This condition is caused by Campylobacter bacteria. You may get this infection if you: Drink water that is contaminated by stool (feces) of an infected animal or person. Eat raw or undercooked meat from an infected animal. Eat raw or undercooked fruits or vegetables that have come in contact with the feces or meat of an infected animal. Drink milk that has not been heated enough to kill bacteria (has not been pasteurized). Touch anything that is contaminated, then touch your mouth before you have washed your hands. What increases the risk? People at higher risk of infection include: Young children. Elderly people. People with a weak immune system, such as people who have AIDS (acquired immunodeficiency syndrome), cancer, or other long-term (chronic) diseases. Pregnant women. What are the signs or symptoms? Symptoms of this condition include: Watery diarrhea, which may be bloody. Pain in the abdomen. Cramps. Fever. Nausea and vomiting. Muscle aches or headache. Symptoms usually start 2-5 days after infection occurs. Symptoms may be moresevere in people who have a weak immune  system. Healthy people may have the infection without showing any symptoms. How is this diagnosed? This condition may be diagnosed based on: Your symptoms. Recent history of travel to a place where water contamination is common. A physical exam. Testing a sample of stool or body fluid to check for bacteria. How is this treated? In most cases, this condition goes away without treatment within 7 days. Your health care provider may: Recommend an ORS (oral rehydration solution). This is a drink that helps you replace fluids and electrolytes (rehydrate). It is found at pharmacies and retail stores. Prescribe medicines to relieve nausea, vomiting, or diarrhea. Prescribe antibiotic medicine, if you have a weak immune system or severe symptoms. Give you IV fluids at the hospital to prevent dehydration, if you cannot drink enough to replace fluids that you lost because of severe vomiting or diarrhea. Follow these instructions at home:  Take over-the-counter and prescription medicines only as told by your health care provider. Drink enough fluid to keep your urine pale yellow. This is especially important if you are vomiting or if you have diarrhea. Drink clear fluids as you are able. Clear fluids include water, ice chips, diluted fruit juice, and low-calorie sports drinks. Eat small meals throughout the day that include healthy foods such as whole grains, lean meats, and fruits and vegetables. Avoid fluids that contain a lot of sugar or caffeine, such as energy drinks, soda, and some sports drinks. Avoid alcohol. If you were prescribed an antibiotic, take it as told by your health care provider. Do not stop taking the antibiotic even if you start to feel better. Rest at home until your symptoms go away. Return to your normal activities as told by your health care provider. Wash your hands often with soap and hot water.  If soap and water are not available, use hand sanitizer. Keep all follow-up visits  as told by your health care provider. This is important. How is this prevented?  Use a meat thermometer to make sure you cook meat to the recommended temperature. Always wash fruits and vegetables before eating or preparing them. Do not drink unpasteurized (raw) milk. Do not let food come in contact with raw meat or any juice from raw meat. After you prepare raw meat, wash your hands, countertops, cutting boards, and all utensils with hot water and soap. If you use a cloth dishtowel, do not use it again until you have washed it. Wash your hands with hot water and soap after going to the bathroom, changing a diaper, or coming in contact with pet feces. When traveling in a country where contamination is common: Do not eat any uncooked foods. Drink only bottled water. Contact a health care provider if: Your symptoms last more than 7 days. Your symptoms get worse. You have a fever. Get help right away if: You cannot drink fluids without vomiting. You have difficulty breathing. You have symptoms of dehydration, such as: Dry skin. Thirst. Dark urine. Decreased urination. Tiredness. Confusion. You develop weakness or paralysis. Summary Campylobacter gastroenteritis is a common infection that can cause diarrhea and other symptoms. It is caused by bacteria. This condition spreads easily to other animals and humans through food and water that contain the bacteria (contaminated food and water). In most cases, this condition goes away without treatment within 7 days. This information is not intended to replace advice given to you by your health care provider. Make sure you discuss any questions you have with your healthcare provider. Document Revised: 03/20/2020 Document Reviewed: 03/21/2020 Elsevier Patient Education  2022 [employer].

## 2021-03-09 NOTE — Progress Notes (Signed)
Established Patient Office Visit  Subjective:  Patient ID: Lisa Pierce, female    DOB: 04-Jul-1986  Age: 35 y.o. MRN: [id number]  CC:  Chief Complaint  Patient presents with   Nausea   Follow-up    HPI Lisa Pierce presents to the emergency department with nausea, vomiting and lower abdominal pain.  Patient was negative for COVID-19 CT scan did not show any acute abnormality, patient was treated with nausea for Zofran, and diagnosed with gastroenteritis patient is gradually improving with only slight discomfort right lower quadrant pain patient was also concerned about Merena inserted over  7 to 8 years ago.      Social History   Socioeconomic History   Marital status: Married    Spouse name: Not on file   Number of children: Not on file   Years of education: Not on file   Highest education level: Not on file  Occupational History   Not on file  Tobacco Use   Smoking status: Every Day    Packs/day: 0.50    Types: Cigarettes   Smokeless tobacco: Never  Vaping Use   Vaping Use: Never used  Substance and Sexual Activity   Alcohol use: Not on file   Drug use: Yes    Frequency: 2.0 times per week    Types: Marijuana   Sexual activity: Yes    Birth control/protection: I.U.D.  Other Topics Concern   Not on file  Social History Narrative   Not on file   Social Determinants of Health   Financial Resource Strain: Not on file  Food Insecurity: Not on file  Transportation Needs: Not on file  Physical Activity: Not on file  Stress: Not on file  Social Connections: Not on file  Intimate Partner Violence: Not on file    Outpatient Medications Prior to Visit  Medication Sig Dispense Refill   diphenoxylate-atropine (LOMOTIL) 2.5-0.025 MG tablet Take 1 tablet by mouth 4 (four) times daily as needed for diarrhea or loose stools. 12 tablet 0   levonorgestrel (MIRENA) 20 MCG/DAY IUD 1 each by Intrauterine route once.     ondansetron (ZOFRAN ODT) 4 MG disintegrating  tablet Take 1 tablet (4 mg total) by mouth every 8 (eight) hours as needed for nausea or vomiting. 20 tablet 0   gabapentin (NEURONTIN) 300 MG capsule Take 1 capsule (300 mg total) by mouth 3 (three) times daily. Take 1 capsule by mouth 3 times daily for 3 days then 1 capsule by mouth twice daily for 1 day. 30 capsule 0   No facility-administered medications prior to visit.    ROS Review of Systems  Constitutional: Negative.  Negative for fatigue and fever.  HENT: Negative.    Respiratory: Negative.    Cardiovascular: Negative.   Gastrointestinal:  Positive for nausea.  Skin:  Negative for rash.  All other systems reviewed and are negative.    Objective:    Physical Exam Vitals and nursing note reviewed.  Constitutional:      Appearance: Normal appearance.  HENT:     Head: Normocephalic.     Nose: Nose normal.  Eyes:     Conjunctiva/sclera: Conjunctivae normal.  Cardiovascular:     Rate and Rhythm: Normal rate and regular rhythm.  Pulmonary:     Effort: Pulmonary effort is normal.     Breath sounds: Normal breath sounds.  Abdominal:     General: Bowel sounds are normal.     Tenderness: There is abdominal tenderness. There is no right  CVA tenderness or left CVA tenderness.  Skin:    Findings: No rash.  Neurological:     Mental Status: She is alert and oriented to person, place, and time.  Psychiatric:        Attention and Perception: Attention and perception normal.        Mood and Affect: Mood is anxious.        Behavior: Behavior normal.        Thought Content: Thought content normal.    BP 114/77   Pulse 62   Temp 97.6 F (36.4 C) (Temporal)   Ht 5\' 6"  (1.676 m)   Wt 152 lb (68.9 kg)   BMI 24.53 kg/m  Wt Readings from Last 3 Encounters:  03/09/21 152 lb (68.9 kg)  03/05/21 150 lb (68 kg)  01/05/21 155 lb (70.3 kg)     Health Maintenance Due  Topic Date Due   COVID-19 Vaccine (1) Never done   Pneumococcal Vaccine 61-61 Years old (1 - PCV) Never done    HIV Screening  Never done   Hepatitis C Screening  Never done   INFLUENZA VACCINE  03/02/2021    There are no preventive care reminders to display for this patient.  No results found for: TSH Lab Results  Component Value Date   WBC 5.9 03/05/2021   HGB 13.6 03/05/2021   HCT 40.1 03/05/2021   MCV 93.0 03/05/2021   PLT 365 03/05/2021   Lab Results  Component Value Date   NA 135 03/05/2021   K 3.5 03/05/2021   CO2 22 03/05/2021   GLUCOSE 125 (H) 03/05/2021   BUN 20 03/05/2021   CREATININE 0.82 03/05/2021   BILITOT 0.8 03/05/2021   ALKPHOS 50 03/05/2021   AST 25 03/05/2021   ALT 46 (H) 03/05/2021   PROT 7.2 03/05/2021   ALBUMIN 4.5 03/05/2021   CALCIUM 9.0 03/05/2021   ANIONGAP 7 03/05/2021      Assessment & Plan:   Problem List Items Addressed This Visit       Other   Right lower quadrant abdominal pain - Primary    Improved abdominal pain symptoms, advised patient to avoid dairy product with worsening nausea and diarrhea, incorporate BRAT diet, increase hydration and follow up with worsening unresolved symptoms.       Relevant Medications   ibuprofen (ADVIL) 600 MG tablet   Nausea in adult    For nausea advised patient to continue Zofran as prescribed in the emergency department.  Follow-up with worsening unresolved symptoms.       Anxiety    Discussed GAD-7 results with patient.  Patient is not interested in treating anxiety today and will follow-up if she needs medication.  Education provided to patient printed handouts given.        Meds ordered this encounter  Medications   ibuprofen (ADVIL) 600 MG tablet    Sig: Take 1 tablet (600 mg total) by mouth every 8 (eight) hours as needed.    Dispense:  30 tablet    Refill:  0    Order Specific Question:   Supervising Provider    Answer:   05/05/2021 Raliegh Ip     Follow-up: Return if symptoms worsen or fail to improve.    [[id number]], NP

## 2021-03-09 NOTE — Assessment & Plan Note (Signed)
Discussed GAD-7 results with patient.  Patient is not interested in treating anxiety today and will follow-up if she needs medication.  Education provided to patient printed handouts given.

## 2021-03-09 NOTE — Assessment & Plan Note (Signed)
For nausea advised patient to continue Zofran as prescribed in the emergency department.  Follow-up with worsening unresolved symptoms.

## 2021-03-12 ENCOUNTER — Telehealth: Payer: Self-pay | Admitting: Nurse Practitioner

## 2021-03-12 NOTE — Telephone Encounter (Signed)
REFERRAL REQUEST Telephone Note  Have you been seen at our office for this problem? Pt wants referral to gi dr. She saw Erie Noe for stomach issues and was sent to hospital. They didn't find anything. She said someone told her is sound like she has diverticulitis (Advise that they may need an appointment with their PCP before a referral can be done)  Reason for Referral: see above Referral discussed with patient: pt was seen and sent to hosptial Best contact number of patient for referral team: [phone] Has patient been seen by a specialist for this issue before: no Patient provider preference for referral: na Patient location preference for referral: na   Patient notified that referrals can take up to a week or longer to process. If they haven't heard anything within a week they should call back and speak with the referral department.

## 2021-03-12 NOTE — Telephone Encounter (Signed)
LOV 03/09/2021 RLQ pain

## 2021-03-18 ENCOUNTER — Encounter: Payer: Self-pay | Admitting: Nurse Practitioner

## 2021-03-18 ENCOUNTER — Ambulatory Visit (INDEPENDENT_AMBULATORY_CARE_PROVIDER_SITE_OTHER): Payer: 59 | Admitting: Nurse Practitioner

## 2021-03-18 ENCOUNTER — Other Ambulatory Visit: Payer: Self-pay

## 2021-03-18 VITALS — BP 120/81 | HR 59 | Temp 98.2°F | Ht 66.0 in | Wt 149.0 lb

## 2021-03-18 DIAGNOSIS — R1031 Right lower quadrant pain: Secondary | ICD-10-CM | POA: Diagnosis not present

## 2021-03-18 MED ORDER — PANTOPRAZOLE SODIUM 20 MG PO TBEC: 20.0000 mg | DELAYED_RELEASE_TABLET | Freq: Every day | ORAL | 0 refills | Status: DC

## 2021-03-18 NOTE — Progress Notes (Signed)
Acute Office Visit  Subjective:    Patient ID: Lisa Pierce, female    DOB: 1985/09/12, 35 y.o.   MRN: [id number]  Chief Complaint  Patient presents with   Abdominal Pain    HPI Patient is in today for unresolved right lower quadrant abdominal pain. Last CT of abdomen /bowel showed that the stomach is unremarkable. There is no evidence of bowel obstruction. There is no abnormal bowel thickening or inflammatory change. The appendix is not identified and there is no pericecal inflammatory change.    Social History   Socioeconomic History   Marital status: Married    Spouse name: Not on file   Number of children: Not on file   Years of education: Not on file   Highest education level: Not on file  Occupational History   Not on file  Tobacco Use   Smoking status: Every Day    Packs/day: 0.50    Types: Cigarettes   Smokeless tobacco: Never  Vaping Use   Vaping Use: Never used  Substance and Sexual Activity   Alcohol use: Not on file   Drug use: Yes    Frequency: 2.0 times per week    Types: Marijuana   Sexual activity: Yes    Birth control/protection: I.U.D.  Other Topics Concern   Not on file  Social History Narrative   Not on file   Social Determinants of Health   Financial Resource Strain: Not on file  Food Insecurity: Not on file  Transportation Needs: Not on file  Physical Activity: Not on file  Stress: Not on file  Social Connections: Not on file  Intimate Partner Violence: Not on file    Outpatient Medications Prior to Visit  Medication Sig Dispense Refill   diphenoxylate-atropine (LOMOTIL) 2.5-0.025 MG tablet Take 1 tablet by mouth 4 (four) times daily as needed for diarrhea or loose stools. 12 tablet 0   ibuprofen (ADVIL) 600 MG tablet Take 1 tablet (600 mg total) by mouth every 8 (eight) hours as needed. 30 tablet 0   levonorgestrel (MIRENA) 20 MCG/DAY IUD 1 each by Intrauterine route once.     ondansetron (ZOFRAN ODT) 4 MG disintegrating  tablet Take 1 tablet (4 mg total) by mouth every 8 (eight) hours as needed for nausea or vomiting. 20 tablet 0   No facility-administered medications prior to visit.    No Known Allergies  Review of Systems  Constitutional: Negative.   HENT: Negative.    Eyes: Negative.   Respiratory: Negative.    Gastrointestinal:  Positive for abdominal pain. Negative for abdominal distention.  Genitourinary: Negative.   Musculoskeletal: Negative.   Skin:  Negative for rash.  Psychiatric/Behavioral:  The patient is not nervous/anxious.   All other systems reviewed and are negative.     Objective:    Physical Exam Vitals and nursing note reviewed.  Constitutional:      Appearance: Normal appearance.  HENT:     Head: Normocephalic.     Nose: Nose normal.  Eyes:     Conjunctiva/sclera: Conjunctivae normal.  Cardiovascular:     Rate and Rhythm: Normal rate and regular rhythm.  Pulmonary:     Effort: Pulmonary effort is normal.     Breath sounds: Normal breath sounds.  Abdominal:     General: Bowel sounds are normal.  Musculoskeletal:        General: Normal range of motion.  Skin:    General: Skin is warm.  Neurological:     Mental Status: She  is alert and oriented to person, place, and time.  Psychiatric:        Behavior: Behavior normal.    BP 120/81   Pulse (!) 59   Temp 98.2 F (36.8 C) (Temporal)   Ht 5\' 6"  (1.676 m)   Wt 149 lb (67.6 kg)   BMI 24.05 kg/m  Wt Readings from Last 3 Encounters:  03/18/21 149 lb (67.6 kg)  03/09/21 152 lb (68.9 kg)  03/05/21 150 lb (68 kg)    Health Maintenance Due  Topic Date Due   COVID-19 Vaccine (1) Never done   Pneumococcal Vaccine 50-13 Years old (1 - PCV) Never done   HIV Screening  Never done   Hepatitis C Screening  Never done   INFLUENZA VACCINE  03/02/2021    There are no preventive care reminders to display for this patient.   No results found for: TSH Lab Results  Component Value Date   WBC 5.9 03/05/2021   HGB  13.6 03/05/2021   HCT 40.1 03/05/2021   MCV 93.0 03/05/2021   PLT 365 03/05/2021   Lab Results  Component Value Date   NA 135 03/05/2021   K 3.5 03/05/2021   CO2 22 03/05/2021   GLUCOSE 125 (H) 03/05/2021   BUN 20 03/05/2021   CREATININE 0.82 03/05/2021   BILITOT 0.8 03/05/2021   ALKPHOS 50 03/05/2021   AST 25 03/05/2021   ALT 46 (H) 03/05/2021   PROT 7.2 03/05/2021   ALBUMIN 4.5 03/05/2021   CALCIUM 9.0 03/05/2021   ANIONGAP 7 03/05/2021   No results found for: CHOL No results found for: HDL No results found for: LDLCALC No results found for: TRIG No results found for: CHOLHDL No results found for: 05/05/2021     Assessment & Plan:   Problem List Items Addressed This Visit       Other   Right lower quadrant abdominal pain - Primary    Unresolved right lower abdominal pain.  Completed referral to GI for further evaluation.  Plan to rule out gastric ulcers.  Patient continues to report pain with food, started patient on protonix, as she has been taking pepcid over the counter for a while and has had relief. Last VVOH6W showed: The stomach is unremarkable. There is no evidence of bowel obstruction. There is no abnormal bowel thickening or inflammatory change. The appendix is not identified and there is no pericecal inflammatory change.      Relevant Medications   pantoprazole (PROTONIX) 20 MG tablet   Other Relevant Orders   Ambulatory referral to Gastroenterology     Meds ordered this encounter  Medications   pantoprazole (PROTONIX) 20 MG tablet    Sig: Take 1 tablet (20 mg total) by mouth daily.    Dispense:  30 tablet    Refill:  0    Order Specific Question:   Supervising Provider    Answer:   [place] Raliegh Ip     [[id number]], NP

## 2021-03-18 NOTE — Patient Instructions (Signed)
Abdominal Pain, Adult Many things can cause belly (abdominal) pain. Most times, belly pain is not dangerous. Many cases of belly pain can be watched and treated at home. Sometimes, though, belly pain is serious. Yourdoctor will try to find the cause of your belly pain. Follow these instructions at home:  Medicines Take over-the-counter and prescription medicines only as told by your doctor. Do not take medicines that help you poop (laxatives) unless told by your doctor. General instructions Watch your belly pain for any changes. Drink enough fluid to keep your pee (urine) pale yellow. Keep all follow-up visits as told by your doctor. This is important. Contact a doctor if: Your belly pain changes or gets worse. You are not hungry, or you lose weight without trying. You are having trouble pooping (constipated) or have watery poop (diarrhea) for more than 2-3 days. You have pain when you pee or poop. Your belly pain wakes you up at night. Your pain gets worse with meals, after eating, or with certain foods. You are vomiting and cannot keep anything down. You have a fever. You have blood in your pee. Get help right away if: Your pain does not go away as soon as your doctor says it should. You cannot stop vomiting. Your pain is only in areas of your belly, such as the right side or the left lower part of the belly. You have bloody or black poop, or poop that looks like tar. You have very bad pain, cramping, or bloating in your belly. You have signs of not having enough fluid or water in your body (dehydration), such as: Dark pee, very little pee, or no pee. Cracked lips. Dry mouth. Sunken eyes. Sleepiness. Weakness. You have trouble breathing or chest pain. Summary Many cases of belly pain can be watched and treated at home. Watch your belly pain for any changes. Take over-the-counter and prescription medicines only as told by your doctor. Contact a doctor if your belly pain  changes or gets worse. Get help right away if you have very bad pain, cramping, or bloating in your belly. This information is not intended to replace advice given to you by your health care provider. Make sure you discuss any questions you have with your healthcare provider. Document Revised: 11/27/2018 Document Reviewed: 11/27/2018 Elsevier Patient Education  2022 [employer]  

## 2021-03-18 NOTE — Telephone Encounter (Signed)
Appointment made for f/u with [facility]

## 2021-03-18 NOTE — Assessment & Plan Note (Signed)
Unresolved right lower abdominal pain.  Completed referral to GI for further evaluation.  Plan to rule out gastric ulcers.  Patient continues to report pain with food, started patient on protonix, as she has been taking pepcid over the counter for a while and has had relief. Last US showed: The stomach is unremarkable. There is no evidence of bowel obstruction. There is no abnormal bowel thickening or inflammatory change. The appendix is not identified and there is no pericecal inflammatory change.

## 2021-03-24 ENCOUNTER — Encounter: Payer: Self-pay | Admitting: Internal Medicine

## 2021-05-29 ENCOUNTER — Encounter: Payer: Self-pay | Admitting: Nurse Practitioner

## 2021-05-29 ENCOUNTER — Ambulatory Visit (INDEPENDENT_AMBULATORY_CARE_PROVIDER_SITE_OTHER): Payer: 59 | Admitting: Nurse Practitioner

## 2021-05-29 DIAGNOSIS — U071 COVID-19: Secondary | ICD-10-CM | POA: Insufficient documentation

## 2021-05-29 MED ORDER — MOLNUPIRAVIR EUA 200MG CAPSULE: 4.0000 | ORAL_CAPSULE | Freq: Two times a day (BID) | ORAL | 0 refills | Status: AC

## 2021-05-29 NOTE — Progress Notes (Signed)
   Virtual Visit  Note Due to COVID-19 pandemic this visit was conducted virtually. This visit type was conducted due to national recommendations for restrictions regarding the COVID-19 Pandemic (e.g. social distancing, sheltering in place) in an effort to limit this patient's exposure and mitigate transmission in our community. All issues noted in this document were discussed and addressed.  A physical exam was not performed with this format.  I connected with KAYDIN LABO on 05/29/21 at 09:50 am  by telephone and verified that I am speaking with the correct person using two identifiers. Lisa Pierce is currently located at home during visit. The provider, Daryll Drown, NP is located in their office at time of visit.  I discussed the limitations, risks, security and privacy concerns of performing an evaluation and management service by telephone and the availability of in person appointments. I also discussed with the patient that there may be a patient responsible charge related to this service. The patient expressed understanding and agreed to proceed.   History and Present Illness:  HPI   Patient positive for at home COVID-19 test.  With symptoms of cough, congestion, body ache, and ringing in the ear denies fever, headache, nausea and vomiting.   Review of Systems  Constitutional:  Negative for chills and fever.  HENT:  Positive for congestion and ear pain.   Respiratory:  Positive for cough.   Cardiovascular: Negative.   Skin: Negative.  Negative for rash.  All other systems reviewed and are negative.   Observations/Objective: Televisit patient not in distress.  Assessment and Plan: Take meds as prescribed - Use a cool mist humidifier  -Use saline nose sprays frequently -Force fluids -For fever or aches or pains- take Tylenol or ibuprofen. - Molnupirivir antiviral RX sent to pharmacy Education provided to patient, patient verbalized understanding. Follow up with  worsening unresolved symptoms   Follow Up Instructions: Follow-up with worsening unresolved symptoms.    I discussed the assessment and treatment plan with the patient. The patient was provided an opportunity to ask questions and all were answered. The patient agreed with the plan and demonstrated an understanding of the instructions.   The patient was advised to call back or seek an in-person evaluation if the symptoms worsen or if the condition fails to improve as anticipated.  The above assessment and management plan was discussed with the patient. The patient verbalized understanding of and has agreed to the management plan. Patient is aware to call the clinic if symptoms persist or worsen. Patient is aware when to return to the clinic for a follow-up visit. Patient educated on when it is appropriate to go to the emergency department.   Time call ended: 10 AM  I provided 10 minutes of  non face-to-face time during this encounter.    Daryll Drown, NP

## 2021-05-29 NOTE — Patient Instructions (Signed)
10 Things You Can Do to Manage Your COVID-19 Symptoms at Home If you have possible or confirmed COVID-19 Stay home except to get medical care. Monitor your symptoms carefully. If your symptoms get worse, call your healthcare provider immediately. Get rest and stay hydrated. If you have a medical appointment, call the healthcare provider ahead of time and tell them that you have or may have COVID-19. For medical emergencies, call 911 and notify the dispatch personnel that you have or may have COVID-19. Cover your cough and sneezes with a tissue or use the inside of your elbow. Wash your hands often with soap and water for at least 20 seconds or clean your hands with an alcohol-based hand sanitizer that contains at least 60% alcohol. As much as possible, stay in a specific room and away from other people in your home. Also, you should use a separate bathroom, if available. If you need to be around other people in or outside of the home, wear a mask. Avoid sharing personal items with other people in your household, like dishes, towels, and bedding. Clean all surfaces that are touched often, like counters, tabletops, and doorknobs. Use household cleaning sprays or wipes according to the label instructions. cdc.gov/coronavirus 02/15/2020 This information is not intended to replace advice given to you by your health care provider. Make sure you discuss any questions you have with your health care provider. Document Revised: 12/04/2020 Document Reviewed: 12/04/2020 Elsevier Patient Education  2022 [employer]vier Inc.  

## 2021-05-29 NOTE — Assessment & Plan Note (Signed)
Take meds as prescribed - Use a cool mist humidifier  -Use saline nose sprays frequently -Force fluids -For fever or aches or pains- take Tylenol or ibuprofen. - Molnupirivir antiviral RX sent to pharmacy Education provided to patient, patient verbalized understanding. Follow up with worsening unresolved symptoms

## 2021-08-09 NOTE — Progress Notes (Signed)
Referring Provider:   Ivy Lynn, NP Primary Care Physician:  Ivy Lynn, NP Primary Gastroenterologist:  Dr. Abbey Chatters  Chief Complaint  Patient presents with   Abdominal Pain    Right lower abd. Thinks she has more stools than she should. BM's vary. Forgot she had Lomotil   Nausea    And vomiting    HPI:   Lisa Pierce is a 36 y.o. female presenting today at the request of   Ivy Lynn, NP for RLQ abdominal pain.  She is seen in the emergency room in August 2022 for 6-day history of nausea, vomiting, lower abdominal pain localizing to RLQ, and diarrhea.  Laboratory evaluation and urinalysis unrevealing.  CT A/P with no acute findings to explain her symptoms.  Appendix not visualized and no periecal inflammatory changes noted.  Follow-up pelvic ultrasound with no acute findings.  Overall, suspected gastroenteritis.  She was prescribed Zofran and Lomotil.    History of follow-up with PCP 03/09/2021 and reported gradual improvement, only with slight discomfort in the right lower quadrant.  She is concerned about Mirena which has been inserted 7-8 years ago.  She was continued on Zofran, advised to avoid dairy, follow brat diet, increase hydration.  Follow-up with PCP on 03/18/21 for unresolved RLQ abdominal pain.  She was reporting postprandial pain.  She has been taking Pepcid over-the-counter with some relief.  She was started on Protonix 20 mg daily and referred to GI.  Today: Patient reports having 9-10 loose to watery (often [place] 6) bowel movements between 3 AM-9 AM, then an additional 3 bowel movements throughout the rest of the day. Not currently taking anything for diarrhea.  She has associated nausea and vomiting during the early morning hours when diarrhea is most frequent.  Symptoms occur daily and started several months ago.  Denies hematemesis, coffee-ground emesis.  She does report having intermittent bright red blood per rectum and intermittent black stools  back in August, for about 1 month, but none since that time.  FOBT was negative in the emergency room in August.  Also with RLQ abdominal pain that started at the same time of diarrhea.  This is described as more of a gurgling, "something moving around in there" type discomfort.  Denies any sharp pain, abdominal cramping, or burning.  Her discomfort is intermittent and seems to be worse with eating certain food items such as eggs, milk, or something with seeds.  Seems to do okay with cheese, breads, and meats.  Denies any urinary symptoms.  No menstrual period with Mirena in place. No family history of IBD or colon cancer.   No abx prior, travel, sick contacts. Had COVID at [place], but denies viral illness prior to onset of GI symptoms..   Occasional reflux. About 2 days a week. No dysphagia.   Goody powders daily for HA for a long time.   Before she started having stomach problems, she drank about 1/2-1 gallon of whiskey per week.  She did this for 1-2 years, but stopped in June 2022.   Reports she has lost 19 lbs since stopping alcohol. Per chart review, down 7 lbs since June.    History reviewed. No pertinent past medical history.  History reviewed. No pertinent surgical history.  Current Outpatient Medications  Medication Sig Dispense Refill   ibuprofen (ADVIL) 600 MG tablet Take 1 tablet (600 mg total) by mouth every 8 (eight) hours as needed. 30 tablet 0   levonorgestrel (MIRENA) 20 MCG/DAY IUD 1  each by Intrauterine route once.     ondansetron (ZOFRAN ODT) 4 MG disintegrating tablet Take 1 tablet (4 mg total) by mouth every 8 (eight) hours as needed for nausea or vomiting. 20 tablet 0   pantoprazole (PROTONIX) 40 MG tablet Take 1 tablet (40 mg total) by mouth daily. 90 tablet 3   No current facility-administered medications for this visit.    Allergies as of 08/10/2021   (No Known Allergies)    Family History  Problem Relation Age of Onset   Colon cancer Neg Hx      Social History   Socioeconomic History   Marital status: Married    Spouse name: Not on file   Number of children: Not on file   Years of education: Not on file   Highest education level: Not on file  Occupational History   Not on file  Tobacco Use   Smoking status: Every Day    Packs/day: 0.50    Types: Cigarettes   Smokeless tobacco: Never  Vaping Use   Vaping Use: Never used  Substance and Sexual Activity   Alcohol use: Not Currently    Comment: 1/2 to 1 gal whiskey per week. Stopped in June 2022   Drug use: Yes    Types: Marijuana    Comment: Marijuana 3-4 times a week. Previously used pain pills.   Sexual activity: Yes    Birth control/protection: I.U.D.  Other Topics Concern   Not on file  Social History Narrative   Not on file   Social Determinants of Health   Financial Resource Strain: Not on file  Food Insecurity: Not on file  Transportation Needs: Not on file  Physical Activity: Not on file  Stress: Not on file  Social Connections: Not on file  Intimate Partner Violence: Not on file    Review of Systems: Gen: Denies any fever, chills, cold or flulike symptoms, presyncope, syncope. CV: Denies chest pain, heart palpitations. Resp: Denies shortness of breath or cough.  GI: See HPI GU : Denies urinary burning, urinary frequency, urinary hesitancy MS: Denies joint pain Derm: Denies rash Psych: Denies depression, anxiety Heme: See HPI  Physical Exam: BP 122/75    Pulse 69    Temp (!) 97.3 F (36.3 C) (Temporal)    Ht 5' 6"  (1.676 m)    Wt 148 lb 3.2 oz (67.2 kg)    BMI 23.92 kg/m  General:   Alert and oriented. Pleasant and cooperative. Well-nourished and well-developed. Appears comfortable.  Head:  Normocephalic and atraumatic. Eyes:  Without icterus, sclera clear and conjunctiva pink.  Ears:  Normal auditory acuity. Lungs:  Clear to auscultation bilaterally. No wheezes, rales, or rhonchi. No distress.  Heart:  S1, S2 present without murmurs  appreciated.  Abdomen:  +BS, soft, and non-distended. Mild TTP in suprapubic, RLQ, and RUQ region. No HSM noted. No guarding or rebound. No masses appreciated.  Rectal:  Deferred  Msk:  Symmetrical without gross deformities. Normal posture. Extremities:  Without edema. Neurologic:  Alert and  oriented x4;  grossly normal neurologically. Skin:  Intact without significant lesions or rashes. Psych:  Normal mood and affect.    Assessment:  36 year old female with history of alcohol abuse, abstinent since June 2022, presenting today as a new patient at the request of Jac Canavan, NP for RLQ abdominal pain. Patient has multiple complains including RLQ abdominal pain, diarrhea, nausea/vomiting.   Patient reports new onset frequent, loose to watery diarrhea with 10+ bowel movements per day with nocturnal  stools. Symptoms started several months ago and have been associated with nausea, vomiting, intermittent RLQ discomfort, "like something moving around in there", worsened by certain food items including milk, eggs, and items with seeds. Also reports 1 month of intermittent brbpr and black stools in August, now resolved. Reflux symptoms a couple times a week, no on any therapy. Notably, she admits to taking Goody powders daily and previously drinking 1/2-1 gallon of whiskey daily up until June 2022.  She has lost about 7 lbs since June, but this may be secondary to alcohol abstinence. Denies antibiotics, sick contacts, travel prior to onset.  No family history of IBD.  She was evaluated in the emergency room for the same symptoms in August.  CBC and lipase WNL, CMP with glucose 125, ALT 46 (H), otherwise WNL, UA negative.  CT A/P with contrast with no acute findings, likely corpus luteal cyst in right adnexa.  Follow-up pelvic ultrasound with no adnexal mass.  Differential is broad and she may very well have more than one thing going on here. Primary concerns are for infectious diarrhea, IBD, NSAID insult to  GI tract. May also have lactose intolerance, celiac disease, thyroid, alpha gal contributing to diarrhea and abdominal pain. Suspect she likely has gastritis, duodenitis, possibly PUD in the setting of chronic NSAID use, reports of prior black stools, and RUQ TTP on exam. Less consistent with biliary etiology and gallbladder and biliary system appeared normal on CT. she does smoke marijuana, but symptoms are less consistent with cannabis hyperemesis syndrome.  We will check stool studies, inflammatory markers, thyroid function, screen for celiac disease, and pending results, as long as no infectious process is identified, will proceed with EGD and colonoscopy for further evaluation.   Elevated ALT: Slight elevation of ALT at 46 in August 2022.  This is in the setting of history of alcohol abuse, abstinent since June 2022.  Liver appeared normal on CT in August.  We will update LFTs at this time.  Plan: CBC, CMP, TSH, IgA, TTG IgA total, alpha gal panel, ESR, CRP, C. difficile GDH and toxin A/B, GI pathogen panel. Start Protonix 40 mg daily 30 minutes before breakfast. Continue Zofran PRN.  Stop Goody powders and avoid all NSAIDs. Continue to avoid all alcohol. Discontinue marijuana. Trial lactose-free diet for 2-4 weeks to see if this helps with symptoms. Avoid known dietary triggers of diarrhea and abdominal pain. Further recommendations to follow blood work and stool studies.  We will likely pursue colonoscopy and EGD for further evaluation.     Aliene Altes, PA-C [facility] 08/10/2021

## 2021-08-10 ENCOUNTER — Ambulatory Visit (INDEPENDENT_AMBULATORY_CARE_PROVIDER_SITE_OTHER): Payer: 59 | Admitting: Gastroenterology

## 2021-08-10 ENCOUNTER — Encounter: Payer: Self-pay | Admitting: Gastroenterology

## 2021-08-10 ENCOUNTER — Other Ambulatory Visit: Payer: Self-pay

## 2021-08-10 VITALS — BP 122/75 | HR 69 | Temp 97.3°F | Ht 66.0 in | Wt 148.2 lb

## 2021-08-10 DIAGNOSIS — K921 Melena: Secondary | ICD-10-CM

## 2021-08-10 DIAGNOSIS — F1011 Alcohol abuse, in remission: Secondary | ICD-10-CM | POA: Diagnosis not present

## 2021-08-10 DIAGNOSIS — R197 Diarrhea, unspecified: Secondary | ICD-10-CM

## 2021-08-10 DIAGNOSIS — R7401 Elevation of levels of liver transaminase levels: Secondary | ICD-10-CM

## 2021-08-10 DIAGNOSIS — R1031 Right lower quadrant pain: Secondary | ICD-10-CM

## 2021-08-10 DIAGNOSIS — K625 Hemorrhage of anus and rectum: Secondary | ICD-10-CM

## 2021-08-10 DIAGNOSIS — R112 Nausea with vomiting, unspecified: Secondary | ICD-10-CM | POA: Diagnosis not present

## 2021-08-10 MED ORDER — PANTOPRAZOLE SODIUM 40 MG PO TBEC: 40.0000 mg | DELAYED_RELEASE_TABLET | Freq: Every day | ORAL | 3 refills | Status: DC

## 2021-08-10 NOTE — Patient Instructions (Addendum)
Please have labs and stool studies completed at [facility].  Start Protonix 40 mg daily 30 minutes before breakfast.  I have sent a prescription to your pharmacy.  Stop Goody powders powders and avoid all NSAID products including ibuprofen, Aleve, Advil, naproxen, and anything that says "NSAID" on the package.  Continue to avoid all alcohol.  Discontinue marijuana.   Try following a lactose-free diet for the next 2-4 weeks to see if any of your symptoms improved.  Avoid known dietary triggers of your diarrhea and abdominal pain.   Further recommendations to follow blood work and stool studies.  Please let me know of any worsening symptoms in the mean time.   It was nice meeting you today!    Ermalinda Memos, PA-C [facility]

## 2021-08-13 LAB — ALPHA-GAL PANEL
Beef IgE: <0.1 kU/L (ref <0.35)
Class: 0
Class: 0
Class: 0
Galactose-alpha-1,3-galactose IgE: <0.1 kU/L (ref <0.10)
LAMB/MUTTON IGE: <0.1 kU/L (ref <0.35)
Pork IgE: <0.1 kU/L (ref <0.35)

## 2021-08-13 LAB — COMPLETE METABOLIC PANEL WITH GFR
AG Ratio: 2.1 (calc) (ref 1.0–2.5)
ALT: 11 U/L (ref 6–29)
AST: 14 U/L (ref 10–30)
Albumin: 4.5 g/dL (ref 3.6–5.1)
Alkaline phosphatase (APISO): 54 U/L (ref 31–125)
BUN: 11 mg/dL (ref 7–25)
CO2: 27 mmol/L (ref 20–32)
Calcium: 9.5 mg/dL (ref 8.6–10.2)
Chloride: 106 mmol/L (ref 98–110)
Creat: 0.72 mg/dL (ref 0.50–0.97)
Globulin: 2.1 g/dL (calc) (ref 1.9–3.7)
Glucose, Bld: 85 mg/dL (ref 65–99)
Potassium: 4.4 mmol/L (ref 3.5–5.3)
Sodium: 139 mmol/L (ref 135–146)
Total Bilirubin: 0.3 mg/dL (ref 0.2–1.2)
Total Protein: 6.6 g/dL (ref 6.1–8.1)
eGFR: 113 mL/min/{1.73_m2} (ref >=60)

## 2021-08-13 LAB — CBC WITH DIFFERENTIAL/PLATELET
Absolute Monocytes: 362 cells/uL (ref 200–950)
Basophils Absolute: 47 cells/uL (ref 0–200)
Basophils Relative: 0.7 %
Eosinophils Absolute: 389 cells/uL (ref 15–500)
Eosinophils Relative: 5.8 %
HCT: 38.9 % (ref 35.0–45.0)
Hemoglobin: 13 g/dL (ref 11.7–15.5)
Lymphs Abs: 1715 cells/uL (ref 850–3900)
MCH: 30.6 pg (ref 27.0–33.0)
MCHC: 33.4 g/dL (ref 32.0–36.0)
MCV: 91.5 fL (ref 80.0–100.0)
MPV: 9.4 fL (ref 7.5–12.5)
Monocytes Relative: 5.4 %
Neutro Abs: 4188 cells/uL (ref 1500–7800)
Neutrophils Relative %: 62.5 %
Platelets: 359 10*3/uL (ref 140–400)
RBC: 4.25 10*6/uL (ref 3.80–5.10)
RDW: 12 % (ref 11.0–15.0)
Total Lymphocyte: 25.6 %
WBC: 6.7 10*3/uL (ref 3.8–10.8)

## 2021-08-13 LAB — TSH: TSH: 1.37 mIU/L

## 2021-08-13 LAB — TISSUE TRANSGLUTAMINASE, IGA: (tTG) Ab, IgA: <1 U/mL

## 2021-08-13 LAB — IGA: Immunoglobulin A: 75 mg/dL (ref 47–310)

## 2021-08-13 LAB — SEDIMENTATION RATE: Sed Rate: 9 mm/h (ref 0–20)

## 2021-08-13 LAB — C-REACTIVE PROTEIN: CRP: 0.5 mg/L (ref <8.0)

## 2021-08-24 ENCOUNTER — Encounter: Payer: Self-pay | Admitting: *Deleted

## 2022-09-13 DIAGNOSIS — R112 Nausea with vomiting, unspecified: Secondary | ICD-10-CM | POA: Diagnosis not present

## 2023-01-01 IMAGING — CT CT ABD-PELV W/ CM
2 of 4 series · 16 of 46 positions shown, 18 images · IV contrast (Omnipaque or Isovue)
Comparison: None.

CLINICAL DATA: Right lower quadrant abdominal pain

EXAM:
CT ABDOMEN AND PELVIS WITH CONTRAST
TECHNIQUE: Multidetector CT imaging of the abdomen and pelvis was performed
using the standard protocol following bolus administration of
intravenous contrast.
CONTRAST:  75mL OMNIPAQUE IOHEXOL 350 MG/ML SOLN

[Series 2: axial st · axial · 0.83mm/px · z∈[+679,+1104]mm · 13 of 95 slices shown, 15 images]
[im 5/95  soft-tissue]
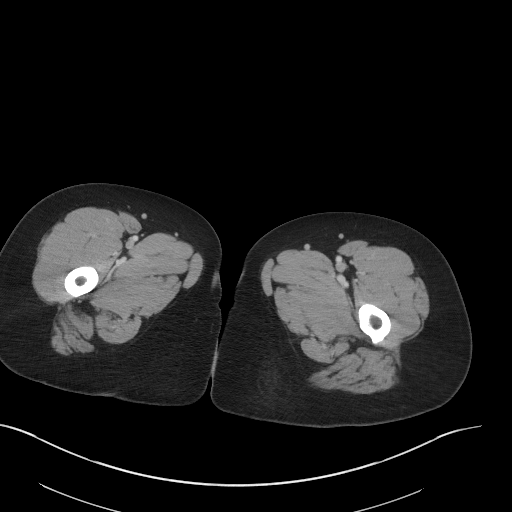
[im 5/95  bone]
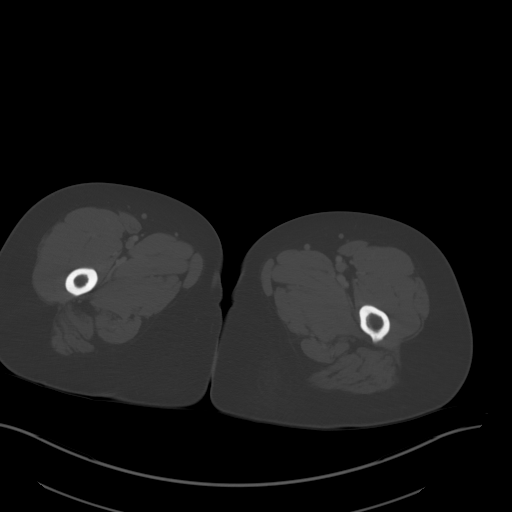
[im 13/95  soft-tissue]
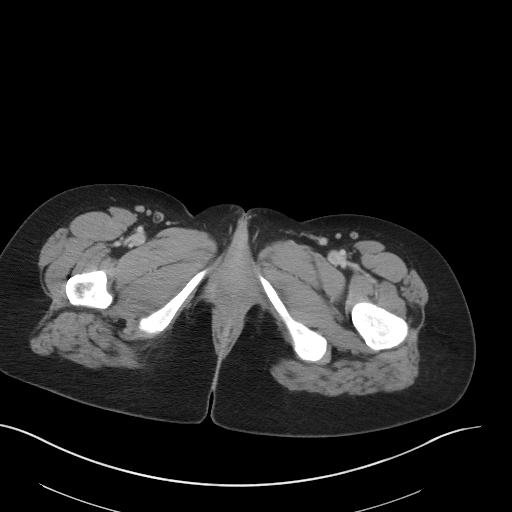
[im 21/95  soft-tissue]
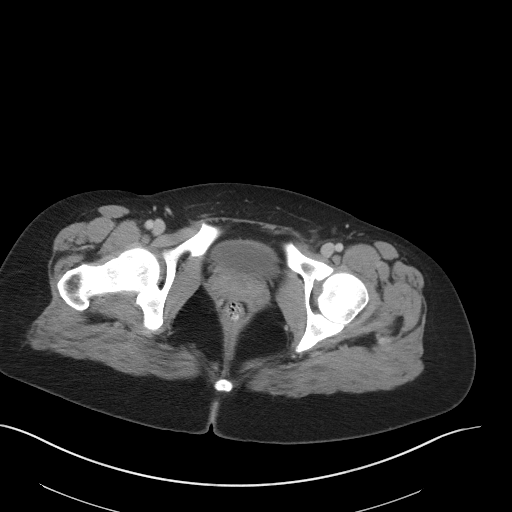
[im 25/95  soft-tissue]
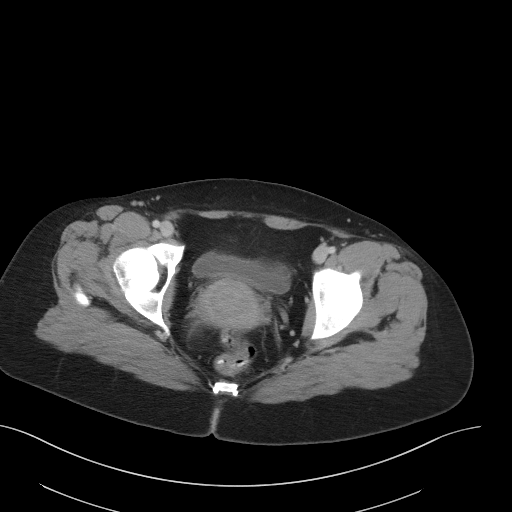
[im 33/95  soft-tissue]
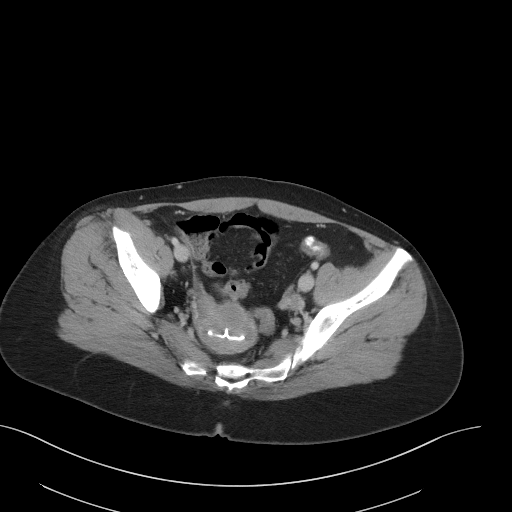
[im 41/95  soft-tissue]
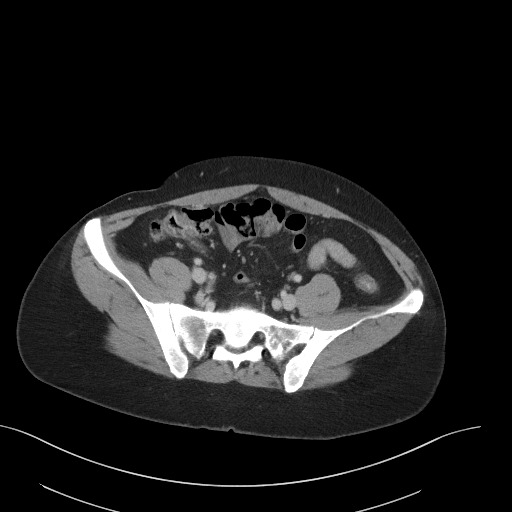
[im 50/95  soft-tissue]
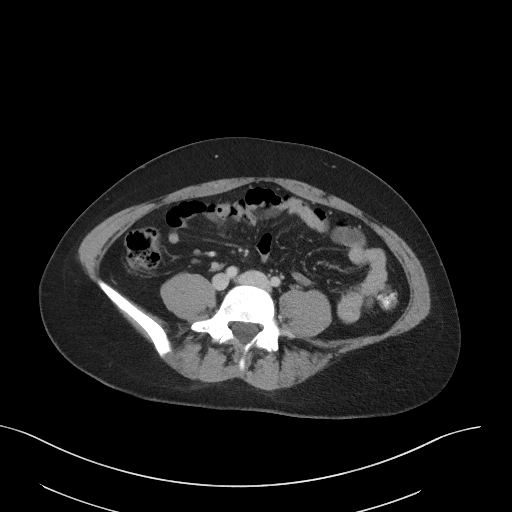
[im 54/95  soft-tissue]
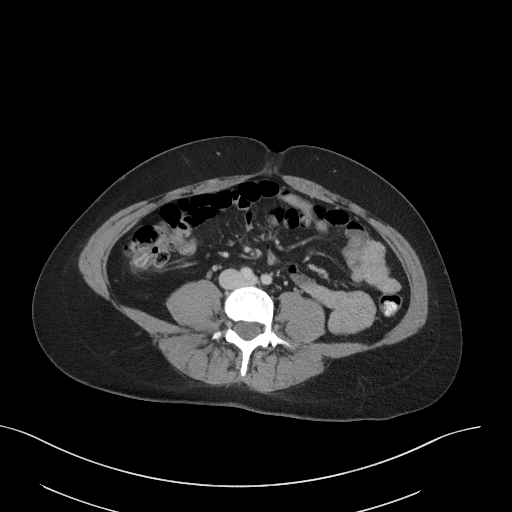
[im 62/95  soft-tissue]
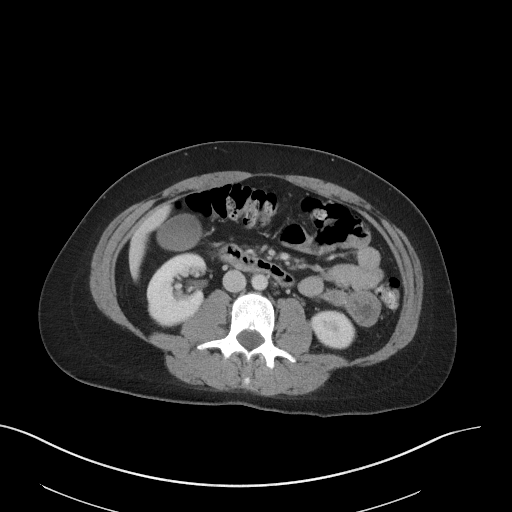
[im 62/95  bone]
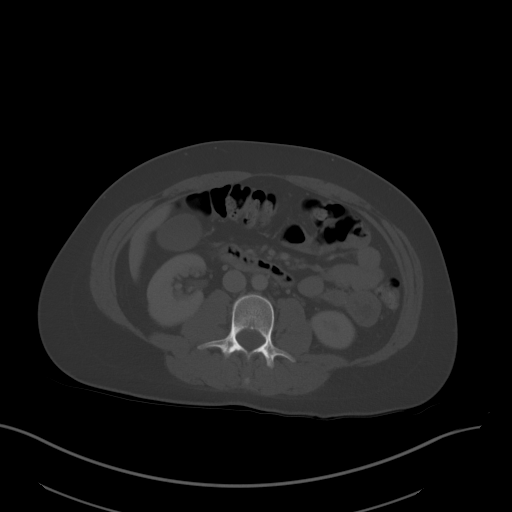
[im 70/95  soft-tissue]
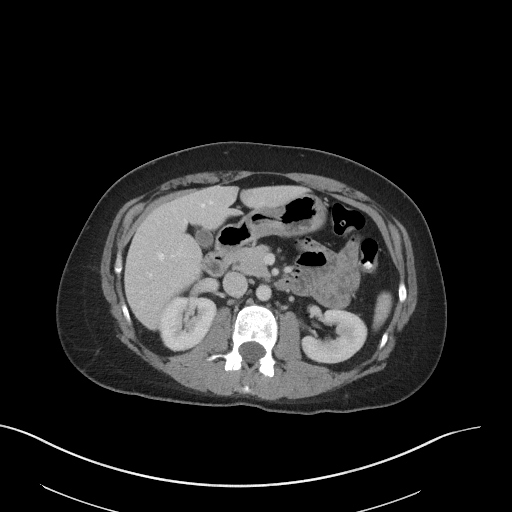
[im 74/95  soft-tissue]
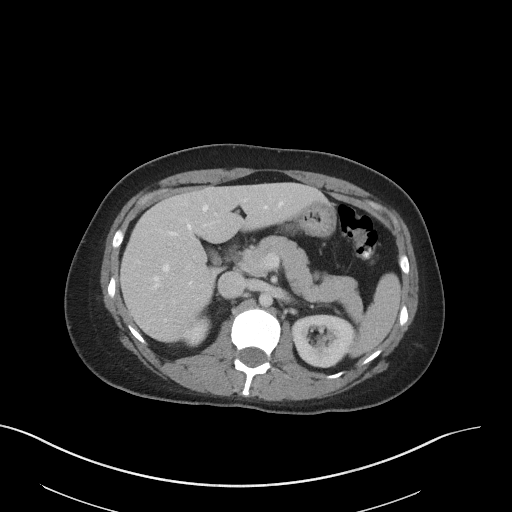
[im 82/95  soft-tissue]
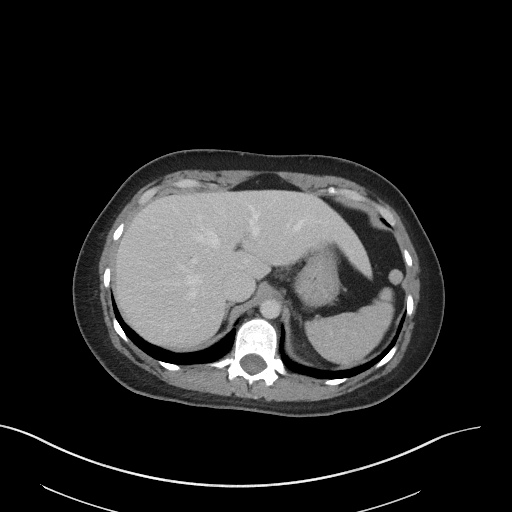
[im 90/95  soft-tissue]
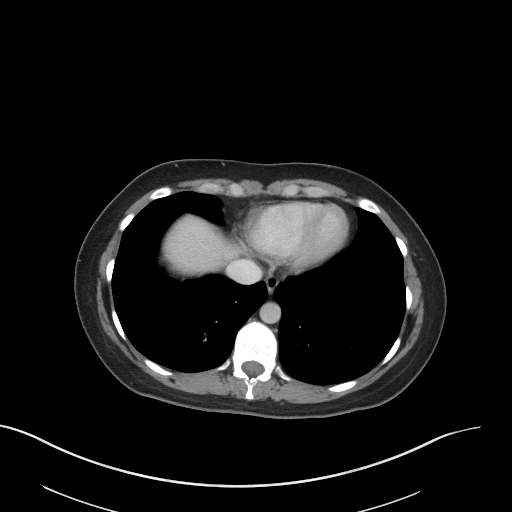

[Series 5: coronal st · coronal · 0.81mm/px · 3 of 90 slices shown]
[im 30/90  soft-tissue]
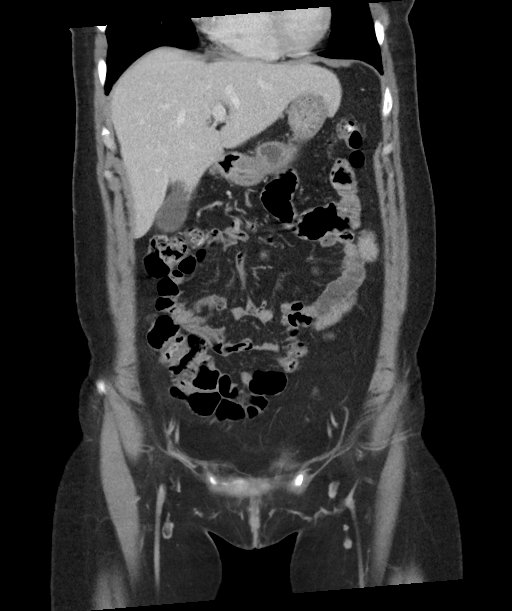
[im 40/90  soft-tissue]
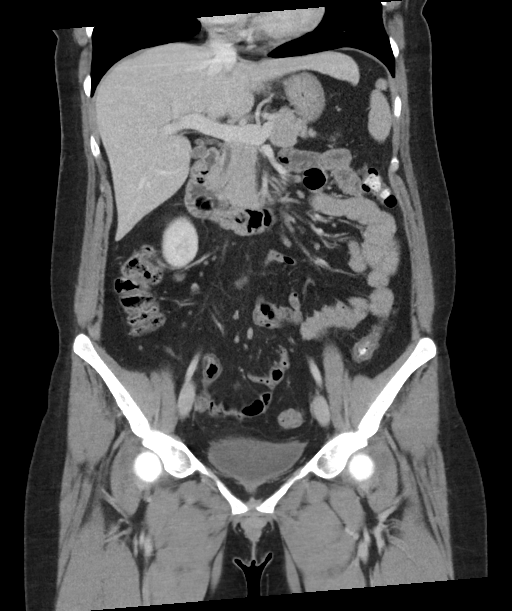
[im 50/90  soft-tissue]
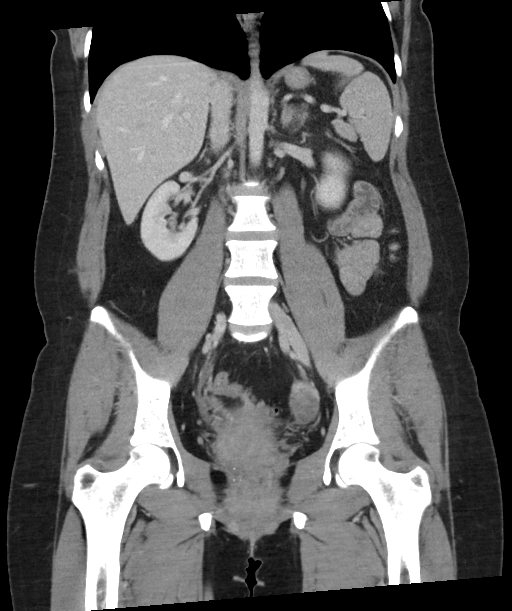

[16 of 46 positions shown; findings below may reference images not displayed]

FINDINGS: Lower chest: The lung bases are clear. The imaged heart is
unremarkable.

Hepatobiliary: The liver is normal. The gallbladder is normal. There
is no intra or extrahepatic biliary ductal dilatation.

Pancreas: Normal.

Spleen: Normal.  There is an accessory splenule.

Adrenals/Urinary Tract: The adrenals are unremarkable. There is a
subcentimeter hypodense lesion in the right kidney lower pole, too
small to characterize but likely a small cyst. The kidneys are
otherwise unremarkable, with no hydronephrosis, enhancing lesion, or
stone. There is no hydroureter. The bladder is unremarkable.

Stomach/Bowel: The stomach is unremarkable. There is no evidence of
bowel obstruction. There is no abnormal bowel thickening or
inflammatory change. The appendix is not identified and there is no
pericecal inflammatory change.

Vascular/Lymphatic: No significant vascular findings are present. No
enlarged abdominal or pelvic lymph nodes.

Reproductive: There is a 1.8 cm peripherally enhancing lesion in the
right adnexa likely reflecting corpus luteal cyst. The adnexa are
otherwise unremarkable. An IUD is in place within the uterus.

Other: There is a small fat containing umbilical hernia. No
abdominopelvic ascites.

Musculoskeletal: There is no acute osseous abnormality.
IMPRESSION: The appendix cannot be identified, but there is no pericecal
inflammatory change. Otherwise unremarkable study with no finding to
explain the patient's pain.

## 2023-01-26 DIAGNOSIS — Z01419 Encounter for gynecological examination (general) (routine) without abnormal findings: Secondary | ICD-10-CM | POA: Diagnosis not present

## 2023-01-26 DIAGNOSIS — Z681 Body mass index (BMI) 19 or less, adult: Secondary | ICD-10-CM | POA: Diagnosis not present

## 2023-12-05 ENCOUNTER — Ambulatory Visit

## 2023-12-05 ENCOUNTER — Encounter: Payer: Self-pay | Admitting: Family Medicine

## 2023-12-05 ENCOUNTER — Ambulatory Visit (INDEPENDENT_AMBULATORY_CARE_PROVIDER_SITE_OTHER): Admitting: Family Medicine

## 2023-12-05 VITALS — BP 123/72 | HR 80 | Temp 98.6°F

## 2023-12-05 DIAGNOSIS — Z23 Encounter for immunization: Secondary | ICD-10-CM | POA: Diagnosis not present

## 2023-12-05 DIAGNOSIS — S61214A Laceration without foreign body of right ring finger without damage to nail, initial encounter: Secondary | ICD-10-CM

## 2023-12-05 MED ORDER — SULFAMETHOXAZOLE-TRIMETHOPRIM 800-160 MG PO TABS: 1.0000 | ORAL_TABLET | Freq: Two times a day (BID) | ORAL | 0 refills | Status: DC

## 2023-12-05 NOTE — Progress Notes (Signed)
 BP 123/72   Pulse 80   Temp 98.6 F (37 C) (Temporal)   SpO2 93%    Subjective:   Patient ID: Lisa Pierce, female    DOB: 1985/11/18, 38 y.o.   MRN: [id number]  HPI: Lisa Pierce is a 38 y.o. female presenting on 12/05/2023 for Laceration (Walk in laceration to right ring finger)   HPI Patient is coming in today for a laceration sustained on the back of her finger.  She says she was washing dishes earlier today and cut the back of her finger on a glass condition.  She does not know if she is up-to-date on tetanus shot.  She will get tetanus shot today because for our records the last 1 is in 2012.  Relevant past medical, surgical, family and social history reviewed and updated as indicated. Interim medical history since our last visit reviewed. Allergies and medications reviewed and updated.  Review of Systems  Constitutional:  Negative for chills and fever.  Skin:  Positive for wound. Negative for color change.  Neurological:  Negative for dizziness and light-headedness.    Per HPI unless specifically indicated above   Allergies as of 12/05/2023   No Known Allergies      Medication List        Accurate as of Dec 05, 2023 11:08 AM. If you have any questions, ask your nurse or doctor.          ibuprofen  600 MG tablet Commonly known as: ADVIL  Take 1 tablet (600 mg total) by mouth every 8 (eight) hours as needed.   levonorgestrel 20 MCG/DAY Iud Commonly known as: MIRENA 1 each by Intrauterine route once.   ondansetron  4 MG disintegrating tablet Commonly known as: Zofran  ODT Take 1 tablet (4 mg total) by mouth every 8 (eight) hours as needed for nausea or vomiting.   pantoprazole  40 MG tablet Commonly known as: PROTONIX  Take 1 tablet (40 mg total) by mouth daily.   sulfamethoxazole-trimethoprim 800-160 MG tablet Commonly known as: BACTRIM DS Take 1 tablet by mouth 2 (two) times daily. Started by: Lucio Sabin Sanayah Munro         Objective:   BP 123/72    Pulse 80   Temp 98.6 F (37 C) (Temporal)   SpO2 93%   Wt Readings from Last 3 Encounters:  08/10/21 148 lb 3.2 oz (67.2 kg)  03/18/21 149 lb (67.6 kg)  03/09/21 152 lb (68.9 kg)    Physical Exam Vitals and nursing note reviewed.  Constitutional:      Appearance: Normal appearance.  Skin:    General: Skin is warm and dry.     Findings: Laceration present.     Comments: Small laceration on the dorsum of her right middle finger, 0.5 cm extending horizontally with the knuckle and 0.25 cm vertically on the lateral aspect overlying the PIP joint. No bony exposure, only fascial layer underneath the fat layer.  Neurological:     Mental Status: She is alert.     Laceration repair: Wound was irrigated with normal saline at pressure. 2% lidocaine with epinephrine was used for local anesthesia, 2 mL. 3-0 Monocryl was used to repair the wound.  Placed 3 sutures. Wound was approximated well and topical antibiotic was used and then it was covered by 4 x 4 and tape told in place. Procedure was tolerated well   Assessment & Plan:   Problem List Items Addressed This Visit   None Visit Diagnoses  Laceration of right ring finger without foreign body without damage to nail, initial encounter    -  Primary   Relevant Medications   sulfamethoxazole-trimethoprim (BACTRIM DS) 800-160 MG tablet       Will do tetanus shot today and sent Bactrim for infection prevention due to depth of wound. Follow up plan: Return if symptoms worsen or fail to improve, for 7 8, suture removal.  Counseling provided for all of the vaccine components No orders of the defined types were placed in this encounter.   Jolyne Needs, MD [facility] 12/05/2023, 11:08 AM

## 2023-12-05 NOTE — Addendum Note (Signed)
 Addended by: Kyra Phy on: 12/05/2023 12:03 PM   Modules accepted: Orders

## 2023-12-12 ENCOUNTER — Ambulatory Visit (INDEPENDENT_AMBULATORY_CARE_PROVIDER_SITE_OTHER): Admitting: Nurse Practitioner

## 2023-12-12 ENCOUNTER — Encounter: Payer: Self-pay | Admitting: Nurse Practitioner

## 2023-12-12 VITALS — BP 146/76 | HR 67 | Temp 97.7°F | Ht 66.0 in | Wt 111.0 lb

## 2023-12-12 DIAGNOSIS — Z4802 Encounter for removal of sutures: Secondary | ICD-10-CM | POA: Insufficient documentation

## 2023-12-12 DIAGNOSIS — S61214D Laceration without foreign body of right ring finger without damage to nail, subsequent encounter: Secondary | ICD-10-CM

## 2023-12-12 DIAGNOSIS — S61214A Laceration without foreign body of right ring finger without damage to nail, initial encounter: Secondary | ICD-10-CM

## 2023-12-12 NOTE — Progress Notes (Signed)
 Acute Office Visit  Subjective:     Patient ID: Lisa Pierce, female    DOB: January 28, 1986, 38 y.o.   MRN: [id number]  Chief Complaint  Patient presents with   Suture / Staple Removal    Finger suture removal    HPI Lisa Pierce is a 38 y.o. female presenting on 12/12/2023 for suture removal right ring finger. She has 3 stiches placed on right ring finger 12/05/2023 and w sprescribed bactrim  which she reports stillhas 3 doses left.       12/12/2023    3:39 PM 03/18/2021    3:51 PM 03/09/2021    9:53 AM  PHQ9 SCORE ONLY  PHQ-9 Total Score 3 6 3       12/12/2023    3:40 PM 03/09/2021    9:54 AM 01/05/2021    3:22 PM  GAD 7 : Generalized Anxiety Score  Nervous, Anxious, on Edge 3 2 2   Control/stop worrying 3 3 3   Worry too much - different things 3 2 1   Trouble relaxing 1 2 2   Restless 0 1 0  Easily annoyed or irritable 2 3 2   Afraid - awful might happen 2 1 0  Total GAD 7 Score 14 14 10   Anxiety Difficulty Somewhat difficult Somewhat difficult Somewhat difficult     Review of Systems  Constitutional:  Negative for chills and fever.  Respiratory:  Negative for cough and wheezing.   Cardiovascular:  Negative for chest pain and leg swelling.  Gastrointestinal:  Negative for constipation, diarrhea, nausea and vomiting.  Skin:  Negative for rash.       Wound right ring finger  Neurological:  Negative for dizziness.  Psychiatric/Behavioral:  The patient is nervous/anxious.    Negative unless indicated in HPI    Objective:    BP (!) 146/76   Pulse 67   Temp 97.7 F (36.5 C) (Temporal)   Ht 5\' 6"  (1.676 m)   Wt 111 lb (50.3 kg)   SpO2 99%   BMI 17.92 kg/m    Physical Exam Vitals and nursing note reviewed.  Constitutional:      General: She is not in acute distress. HENT:     Head: Normocephalic and atraumatic.     Nose: Nose normal.     Mouth/Throat:     Mouth: Mucous membranes are moist.  Eyes:     Extraocular Movements: Extraocular movements intact.      Conjunctiva/sclera: Conjunctivae normal.     Pupils: Pupils are equal, round, and reactive to light.  Cardiovascular:     Pulses: Normal pulses.     Heart sounds: Normal heart sounds.  Pulmonary:     Effort: Pulmonary effort is normal.     Breath sounds: Normal breath sounds.  Skin:    General: Skin is warm.     Findings: Wound present.     Comments: Healing , Well approximated, no erythema  Neurological:     Mental Status: She is alert and oriented to person, place, and time.  Psychiatric:        Mood and Affect: Mood normal.        Behavior: Behavior normal.        Thought Content: Thought content normal.        Judgment: Judgment normal.     No results found for any visits on 12/12/23.      Assessment & Plan:  Visit for suture removal  Other orders -     Suture Removal  Lisa Pierce  is a 38 year old Caucasian female seen today for suture removal, no acute distress. Continue applying  bacitracin; finish the course of antibiotic that was prescribed during the initial encounter Suture Removal  Date/Time: 12/12/2023 3:44 PM  Performed by: Anton Baton, NP Authorized by: Anton Baton, NP  Body area: upper extremity Location details: right ring finger Wound Appearance: clean Sutures Removed: 3 Post-removal: antibiotic ointment applied and dressing applied Facility: sutures placed in this facility Patient tolerance: patient tolerated the procedure well with no immediate complications      The above assessment and management plan was discussed with the patient. The patient verbalized understanding of and has agreed to the management plan. Patient is aware to call the clinic if they develop any new symptoms or if symptoms persist or worsen. Patient is aware when to return to the clinic for a follow-up visit. Patient educated on when it is appropriate to go to the emergency department.  Return if symptoms worsen or fail to improve.  Lisa Senat [place]  Thompson, DNP [facility] [address] [place], [place] [address] [phone]  Note: This document was prepared by [employer] voice dictation technology and any errors that results from this process are unintentional.

## 2024-01-11 ENCOUNTER — Ambulatory Visit: Payer: Self-pay | Admitting: *Deleted

## 2024-01-11 DIAGNOSIS — L0390 Cellulitis, unspecified: Secondary | ICD-10-CM | POA: Diagnosis not present

## 2024-01-11 DIAGNOSIS — S61214A Laceration without foreign body of right ring finger without damage to nail, initial encounter: Secondary | ICD-10-CM | POA: Diagnosis not present

## 2024-01-11 NOTE — Telephone Encounter (Signed)
 Copied from CRM [id number]. Topic: Clinical - Red Word Triage >> Jan 11, 2024  8:00 AM Carlatta H wrote: [facility] that prompted transfer to Nurse Triage: Patient had stiches in finger and stiches were removed about 2 weeks ago//Finger is on right hand ring finger//she was told by wound care to call doctor asap because her finger is infected// Reason for Disposition  [1] Finger wound AND [2] entire finger swollen  Answer Assessment - Initial Assessment Questions 1. LOCATION: Where is the wound located?      I had stitches in my ring on right hand.   The stitches have been removed.  2. WOUND APPEARANCE: What does the wound look like?      My finger is very red and swollen.   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   There is a pocket that looks it has fluid in it.   I'm sure she got the stitches all out.   No drainage.   It's so sore. 3. SIZE: If redness is present, ask: What is the size of the red area? (Inches, centimeters, or compare to size of a coin)      It's very red, swollen and sore. 4. SPREAD: What's changed in the last day?  Do you see any red streaks coming from the wound?     It's very red, swollen and infected looking now 5. ONSET: When did it start to look infected?      I saw the nurse last week at the beginning. 6. MECHANISM: How did the wound start, what was the cause?     I had stitches in it.  They have been removed 7. PAIN: Do you have any pain?  If Yes, ask: How bad is the pain?  (e.g., Scale 1-10; mild, moderate, or severe)    - MILD (1-3): Doesn't interfere with normal activities.     - MODERATE (4-7): Interferes with normal activities or awakens from sleep.    - SEVERE (8-10): Excruciating pain, unable to do any normal activities.       Yes 8. FEVER: Do you have a fever? If Yes, ask: What is your temperature, how was it measured, and when did it start?     No 9. OTHER SYMPTOMS: Do you have any other symptoms?  (e.g., shaking chills, weakness, rash elsewhere on body)     No 10. PREGNANCY: Is there any chance you are pregnant? When was your last menstrual period?       Not asked  Protocols used: Wound Infection Suspected-A-AH FYI Only or Action Required?: FYI only for provider Pt cut her finger and just showed up at [facility] without an appt.   They were able to take her in and stitches were put in.   She came back and had them removed on 12/12/2023.   Now her finger is looking infected.   I was told by staff she would need to establish as a new pt before she could be seen.   They just happened to have an opening when she showed up at the office and was able to stitch her finger then.   Pt. Agreeable to going to the urgent care today.  I did get her scheduled a new pt appt for 05/18/2024 with Lugenia Said, FNP at 1:00.   She requested not to see Adell Hones.    We just didn't click.    I called into the practice after scheduling her because the  system is putting her in as a TOC.  I was unable to schedule her as a new pt.     Staff told me she used to be a pt there.   The office manager is going to look at the appt and if it needs to be fixed or changed they will take care of it.          Patient was last seen in primary care on 12/12/2023 by Anton Baton, NP. Called Nurse Triage reporting Wound Infection. Symptoms began a week ago. Interventions attempted: Nothing. Symptoms are: rapidly worsening.  Triage Disposition: See HCP Within 4 Hours (Or PCP Triage)  Patient/caregiver understands and will follow disposition?:   Yes  Going to urgent care

## 2024-01-11 NOTE — Telephone Encounter (Signed)
 Urgent care today. Noted. LS

## 2024-02-02 ENCOUNTER — Encounter: Payer: Self-pay | Admitting: *Deleted

## 2024-04-24 DIAGNOSIS — R6882 Decreased libido: Secondary | ICD-10-CM | POA: Diagnosis not present

## 2024-04-24 DIAGNOSIS — Z01419 Encounter for gynecological examination (general) (routine) without abnormal findings: Secondary | ICD-10-CM | POA: Diagnosis not present

## 2024-04-24 DIAGNOSIS — Z681 Body mass index (BMI) 19 or less, adult: Secondary | ICD-10-CM | POA: Diagnosis not present

## 2024-05-18 ENCOUNTER — Encounter: Admitting: Family Medicine

## 2024-05-28 ENCOUNTER — Ambulatory Visit (INDEPENDENT_AMBULATORY_CARE_PROVIDER_SITE_OTHER)

## 2024-05-28 ENCOUNTER — Ambulatory Visit (INDEPENDENT_AMBULATORY_CARE_PROVIDER_SITE_OTHER): Admitting: Family Medicine

## 2024-05-28 ENCOUNTER — Encounter: Admitting: Family Medicine

## 2024-05-28 ENCOUNTER — Encounter: Payer: Self-pay | Admitting: Family Medicine

## 2024-05-28 VITALS — BP 134/87 | HR 66 | Temp 98.7°F | Ht 66.0 in | Wt 115.0 lb

## 2024-05-28 DIAGNOSIS — Z13220 Encounter for screening for lipoid disorders: Secondary | ICD-10-CM

## 2024-05-28 DIAGNOSIS — M238X1 Other internal derangements of right knee: Secondary | ICD-10-CM | POA: Diagnosis not present

## 2024-05-28 DIAGNOSIS — F1911 Other psychoactive substance abuse, in remission: Secondary | ICD-10-CM | POA: Insufficient documentation

## 2024-05-28 DIAGNOSIS — F331 Major depressive disorder, recurrent, moderate: Secondary | ICD-10-CM | POA: Insufficient documentation

## 2024-05-28 DIAGNOSIS — R29898 Other symptoms and signs involving the musculoskeletal system: Secondary | ICD-10-CM | POA: Diagnosis not present

## 2024-05-28 DIAGNOSIS — M238X2 Other internal derangements of left knee: Secondary | ICD-10-CM | POA: Insufficient documentation

## 2024-05-28 DIAGNOSIS — F411 Generalized anxiety disorder: Secondary | ICD-10-CM | POA: Diagnosis not present

## 2024-05-28 DIAGNOSIS — Z681 Body mass index (BMI) 19 or less, adult: Secondary | ICD-10-CM | POA: Insufficient documentation

## 2024-05-28 DIAGNOSIS — R6889 Other general symptoms and signs: Secondary | ICD-10-CM

## 2024-05-28 LAB — LIPID PANEL

## 2024-05-28 MED ORDER — DESVENLAFAXINE SUCCINATE ER 50 MG PO TB24: 50.0000 mg | ORAL_TABLET | Freq: Every day | ORAL | 3 refills | Status: AC

## 2024-05-28 NOTE — Progress Notes (Signed)
 New Patient Office Visit  Subjective    Patient ID: Lisa Pierce, female    DOB: [birth year]  Age: 38 y.o. MRN: [id number]  CC:  Chief Complaint  Patient presents with   Knee Pain    HPI [name] presents to establish care.   History of Present Illness   Lisa Pierce is a 38 year old female who presents with worsening knee pain.  She has been experiencing knee pain for the past three years, which has progressively worsened. The pain is described as dull and achy, with occasional sharp pains, and is located all over the kneecap. It is exacerbated by walking and standing, making daily activities challenging. She rates the pain as an eight out of ten, especially during work where she walks a lot. No associated numbness, tingling, or noticeable swelling. She has not had any prior imaging or orthopedic consultations for this issue. Denies injury.  She has a history of alcohol abuse, last used in June 2022, and a history of pain pill abuse during high school. She is currently sober. She also has a history of anxiety and depression, for which she has tried Lexapro and Celexa in the past. Lexapro caused her to 'randomly pass out,' including once while driving. She has not tried SNRIs but is interested in medications without abuse potential. Her symptoms of anxiety and depression are moderate. Her aunt has done well with pristiq and she is interested in this.   She reports significant weight fluctuations, having been as low as 87 pounds and as high as 150 pounds. Her appetite varies, and she does not consistently eat well. She has not been diagnosed with anemia but suspects she might have it as she is always cold. Her husband suggested checking her thyroid due to her weight changes.  She has an IUD and has not had a menstrual cycle in over 13 years, which is normal for her.            05/28/2024    1:42 PM 12/12/2023    3:39 PM 03/18/2021    3:51 PM  Depression screen PHQ 2/9   Decreased Interest 2 1 1   Down, Depressed, Hopeless 2 0 0  PHQ - 2 Score 4 1 1   Altered sleeping 1 0 1  Tired, decreased energy 2 1 1   Change in appetite 2 0 3  Feeling bad or failure about yourself  1 1 0  Trouble concentrating 1 0 0  Moving slowly or fidgety/restless 1 0 0  Suicidal thoughts 0 0 0  PHQ-9 Score 12 3 6   Difficult doing work/chores Somewhat difficult Not difficult at all Somewhat difficult      12/12/2023    3:40 PM 03/09/2021    9:54 AM 01/05/2021    3:22 PM  GAD 7 : Generalized Anxiety Score  Nervous, Anxious, on Edge 3 2 2   Control/stop worrying 3 3 3   Worry too much - different things 3 2 1   Trouble relaxing 1 2 2   Restless 0 1 0  Easily annoyed or irritable 2 3 2   Afraid - awful might happen 2 1 0  Total GAD 7 Score 14 14 10   Anxiety Difficulty Somewhat difficult Somewhat difficult Somewhat difficult      Outpatient Encounter Medications as of 05/28/2024  Medication Sig   levonorgestrel (MIRENA) 20 MCG/DAY IUD 1 each by Intrauterine route once.   [DISCONTINUED] ibuprofen  (ADVIL ) 600 MG tablet Take 1 tablet (600 mg total) by mouth every  8 (eight) hours as needed. (Patient not taking: Reported on 12/12/2023)   [DISCONTINUED] ondansetron  (ZOFRAN  ODT) 4 MG disintegrating tablet Take 1 tablet (4 mg total) by mouth every 8 (eight) hours as needed for nausea or vomiting. (Patient not taking: Reported on 12/12/2023)   [DISCONTINUED] pantoprazole  (PROTONIX ) 40 MG tablet Take 1 tablet (40 mg total) by mouth daily. (Patient not taking: Reported on 12/12/2023)   [DISCONTINUED] sulfamethoxazole -trimethoprim  (BACTRIM  DS) 800-160 MG tablet Take 1 tablet by mouth 2 (two) times daily.   No facility-administered encounter medications on file as of 05/28/2024.    Past Medical History:  Diagnosis Date   Anxiety    Depression    Substance abuse (HCC)    alcohol abuse    History reviewed. No pertinent surgical history.  Family History  Problem Relation Age of Onset    Hypertension Mother    Depression Mother    Anxiety disorder Mother    COPD Father    Cancer Father 35 - 80       stomach cancer   ADD / ADHD Son    Hyperlipidemia Maternal Grandmother    Colon cancer Neg Hx     Social History   Socioeconomic History   Marital status: Married    Spouse name: Not on file   Number of children: Not on file   Years of education: Not on file   Highest education level: Not on file  Occupational History   Not on file  Tobacco Use   Smoking status: Every Day    Current packs/day: 0.50    Types: Cigarettes   Smokeless tobacco: Never  Vaping Use   Vaping status: Never Used  Substance and Sexual Activity   Alcohol use: Not Currently    Comment: 1/2 to 1 gal whiskey per week. Stopped in June 2022   Drug use: Yes    Types: Marijuana    Comment: Marijuana 3-4 times a week. Previously used pain pills.   Sexual activity: Yes    Birth control/protection: I.U.D.  Other Topics Concern   Not on file  Social History Narrative   Not on file   Social Drivers of Health   Financial Resource Strain: Not on file  Food Insecurity: No Food Insecurity (04/24/2024)   [facility] from [facility]   Hunger Vital Sign    Within the past 12 months, you worried that your food would run out before you got the money to buy more.: Never true    Within the past 12 months, the food you bought just didn't last and you didn't have money to get more.: Never true  Transportation Needs: No Transportation Needs (04/24/2024)   Received from [facility] - Transportation    Lack of Transportation (Medical): No    Lack of Transportation (Non-Medical): No  Physical Activity: Inactive (04/24/2024)   Received from [facility]   Exercise Vital Sign    On average, how many days per week do you engage in moderate to strenuous exercise (like a brisk walk)?: 0 days    On average, how many minutes do you engage in exercise at this level?: 0 min  Stress: Stress Concern  Present (04/24/2024)   Received from [facility] - Occupational Stress Questionnaire    Do you feel stress - tense, restless, nervous, or anxious, or unable to sleep at night because your mind is troubled all the time - these days?: Rather much  Social Connections: Not on file  Intimate Partner Violence: Not At Risk (01/26/2023)   Received from [facility]   Humiliation, Afraid, Rape, and Kick questionnaire    Within the last year, have you been afraid of your partner or ex-partner?: No    Within the last year, have you been humiliated or emotionally abused in other ways by your partner or ex-partner?: No    Within the last year, have you been kicked, hit, slapped, or otherwise physically hurt by your partner or ex-partner?: No    Within the last year, have you been raped or forced to have any kind of sexual activity by your partner or ex-partner?: No    ROS As per HPI.      Objective    BP 134/87   Pulse 66   Temp 98.7 F (37.1 C) (Temporal)   Ht 5' 6 (1.676 m)   Wt 115 lb (52.2 kg)   SpO2 100%   BMI 18.56 kg/m   Physical Exam Vitals and nursing note reviewed.  Constitutional:      General: She is not in acute distress.    Appearance: She is underweight. She is not ill-appearing, toxic-appearing or diaphoretic.  Eyes:     General: No scleral icterus. Neck:     Thyroid: No thyroid mass, thyromegaly or thyroid tenderness.  Cardiovascular:     Rate and Rhythm: Normal rate and regular rhythm.     Heart sounds: Normal heart sounds. No murmur heard. Pulmonary:     Effort: Pulmonary effort is normal. No respiratory distress.     Breath sounds: Normal breath sounds. No wheezing, rhonchi or rales.  Musculoskeletal:     Cervical back: Neck supple. No rigidity.     Right knee: Crepitus present. No swelling, effusion or bony tenderness. Normal range of motion. Tenderness present over the lateral joint line and patellar tendon.  Normal alignment.     Instability Tests: Medial McMurray test negative and lateral McMurray test negative.     Left knee: Crepitus present. No swelling, effusion or bony tenderness. Normal range of motion. Tenderness present over the lateral joint line and patellar tendon. Normal alignment.     Instability Tests: Medial McMurray test negative and lateral McMurray test negative.     Right lower leg: No edema.     Left lower leg: No edema.  Skin:    General: Skin is warm and dry.  Neurological:     General: No focal deficit present.     Mental Status: She is alert and oriented to person, place, and time.  Psychiatric:        Mood and Affect: Mood normal.        Behavior: Behavior normal.         Assessment & Plan:   Ryliee was seen today for knee pain.  Diagnoses and all orders for this visit:  Crepitus of both knee joints -     DG Knee 1-2 Views Left -     DG Knee 1-2 Views Left -     Ambulatory referral to Orthopedic Surgery  Generalized anxiety disorder -     desvenlafaxine (PRISTIQ) 50 MG 24 hr tablet; Take 1 tablet (50 mg total) by mouth daily.  Moderate episode of recurrent major depressive disorder (HCC) -     desvenlafaxine (PRISTIQ) 50 MG 24 hr tablet; Take 1 tablet (50 mg total) by mouth daily.  Substance abuse in remission (HCC)  Body mass index (BMI) less than 19 in  adult -     CMP14+EGFR -     TSH -     Anemia Profile B  Cold intolerance -     CMP14+EGFR -     TSH -     Anemia Profile B  Encounter for screening for lipid disorder -     Lipid panel   Assessment and Plan    Bilateral knee pain with crepitus Chronic bilateral knee pain with significant crepitus. X-rays ordered today. Radiology read pending. Orthopedic referral placed for further assessment. - Order bilateral knee x-ray. - Refer to orthopedic specialist. - Perform pregnancy test prior to x-ray.  Depression and generalized anxiety disorder Moderate depression and anxiety with  previous SSRI failures. Initiating Pristiq, an SNRI, due to SSRI intolerance. Discussed side effects and withdrawal symptoms. - Prescribe Pristiq 50 mg daily. - Schedule follow-up in six weeks to assess response.     Substance abuse in remission Sober since 2022.  Cold intolerance Adult BMI <19 Labs pending.  Return in about 6 weeks (around 07/09/2024) for medication follow up.  The patient indicates understanding of these issues and agrees with the plan.   [name], FNP

## 2024-05-29 LAB — ANEMIA PROFILE B
Basophils Absolute: 0 x10E3/uL (ref 0.0–0.2)
Basos: 1 %
EOS (ABSOLUTE): 0.2 x10E3/uL (ref 0.0–0.4)
Eos: 4 %
Ferritin: 67 ng/mL (ref 15–150)
Folate: 8.5 ng/mL (ref >3.0)
Hematocrit: 45.4 % (ref 34.0–46.6)
Hemoglobin: 14.9 g/dL (ref 11.1–15.9)
Immature Grans (Abs): 0 x10E3/uL (ref 0.0–0.1)
Immature Granulocytes: 0 %
Iron Saturation: 28 % (ref 15–55)
Iron: 86 ug/dL (ref 27–159)
Lymphocytes Absolute: 1.7 x10E3/uL (ref 0.7–3.1)
Lymphs: 26 %
MCH: 31.6 pg (ref 26.6–33.0)
MCHC: 32.8 g/dL (ref 31.5–35.7)
MCV: 96 fL (ref 79–97)
Monocytes Absolute: 0.4 x10E3/uL (ref 0.1–0.9)
Monocytes: 6 %
Neutrophils Absolute: 4 x10E3/uL (ref 1.4–7.0)
Neutrophils: 63 %
Platelets: 321 x10E3/uL (ref 150–450)
RBC: 4.71 x10E6/uL (ref 3.77–5.28)
RDW: 11.9 % (ref 11.7–15.4)
Retic Ct Pct: 1 % (ref 0.6–2.6)
Total Iron Binding Capacity: 302 ug/dL (ref 250–450)
UIBC: 216 ug/dL (ref 131–425)
Vitamin B-12: 328 pg/mL (ref 232–1245)
WBC: 6.3 x10E3/uL (ref 3.4–10.8)

## 2024-05-29 LAB — CMP14+EGFR
ALT: 12 IU/L (ref 0–32)
AST: 18 IU/L (ref 0–40)
Albumin: 4.7 g/dL (ref 3.9–4.9)
Alkaline Phosphatase: 59 IU/L (ref 41–116)
BUN/Creatinine Ratio: 15 (ref 9–23)
BUN: 12 mg/dL (ref 6–20)
Bilirubin Total: 0.2 mg/dL (ref 0.0–1.2)
CO2: 23 mmol/L (ref 20–29)
Calcium: 9.7 mg/dL (ref 8.7–10.2)
Chloride: 101 mmol/L (ref 96–106)
Creatinine, Ser: 0.82 mg/dL (ref 0.57–1.00)
Globulin, Total: 2.3 g/dL (ref 1.5–4.5)
Glucose: 81 mg/dL (ref 70–99)
Potassium: 4.5 mmol/L (ref 3.5–5.2)
Sodium: 138 mmol/L (ref 134–144)
Total Protein: 7 g/dL (ref 6.0–8.5)
eGFR: 95 mL/min/1.73 (ref >59)

## 2024-05-29 LAB — LIPID PANEL
Cholesterol, Total: 168 mg/dL (ref 100–199)
HDL: 72 mg/dL (ref >39)
LDL CALC COMMENT:: 2.3 ratio (ref 0.0–4.4)
LDL Chol Calc (NIH): 84 mg/dL (ref 0–99)
Triglycerides: 58 mg/dL (ref 0–149)
VLDL Cholesterol Cal: 12 mg/dL (ref 5–40)

## 2024-05-29 LAB — TSH: TSH: 2.45 u[IU]/mL (ref 0.450–4.500)

## 2024-05-30 ENCOUNTER — Ambulatory Visit: Payer: Self-pay | Admitting: Family Medicine

## 2024-06-22 ENCOUNTER — Other Ambulatory Visit: Payer: Self-pay

## 2024-06-22 ENCOUNTER — Other Ambulatory Visit (INDEPENDENT_AMBULATORY_CARE_PROVIDER_SITE_OTHER): Payer: Self-pay

## 2024-06-22 ENCOUNTER — Ambulatory Visit: Admitting: Orthopedic Surgery

## 2024-06-22 ENCOUNTER — Encounter: Payer: Self-pay | Admitting: Orthopedic Surgery

## 2024-06-22 VITALS — BP 134/87 | Ht 66.0 in | Wt 115.0 lb

## 2024-06-22 DIAGNOSIS — M25561 Pain in right knee: Secondary | ICD-10-CM | POA: Diagnosis not present

## 2024-06-22 DIAGNOSIS — G8929 Other chronic pain: Secondary | ICD-10-CM

## 2024-06-22 DIAGNOSIS — M25562 Pain in left knee: Secondary | ICD-10-CM

## 2024-06-22 DIAGNOSIS — M2242 Chondromalacia patellae, left knee: Secondary | ICD-10-CM | POA: Diagnosis not present

## 2024-06-22 DIAGNOSIS — M2241 Chondromalacia patellae, right knee: Secondary | ICD-10-CM | POA: Diagnosis not present

## 2024-06-22 NOTE — Patient Instructions (Addendum)
 Physical therapy has been ordered for you in [place] . You will need to call and schedule the phone number is [phone]   You have a form of arthritis of a major joint (chondromalacia of the kneecaps)  The recommended treatment for pain is:  Weight loss (does not apply to you)  Tylenol  500 mg every 6 hours  Plus or minus an anti-inflammatory such as Advil , Aleve or prescription Celebrex or meloxicam (you can't take these)  You can use topical medication such as Bengay, Aspercreme, Biofreeze, Voltaren gel  You can apply ice or heat for 20 minutes depending on which one feels better  Oral supplements : chondroitin sulfate and glucosamine can be helpful as well.  Exercise please go to physical therapy   Steroid Injections can be helpful for short term pain relief, but is not recommended as a first-line treatment in your case as it can damage young cartilage cells  Opioids such as codeine, hydrocodone, oxycodone are not indicated for arthritis pain

## 2024-06-22 NOTE — Progress Notes (Signed)
 Office Visit Note   Patient: Lisa Pierce           Date of Birth: Aug 07, 1985           MRN: [id number] Visit Date: 06/22/2024 Requested by: Joesph Annabella HERO, FNP [address] [place],  [place] [address] PCP: Joesph Annabella HERO, FNP   Assessment & Plan:   Encounter Diagnoses  Name Primary?   Bilateral chronic knee pain    Chondromalacia patellae, left knee Yes   Chondromalacia patellae, right knee     No orders of the defined types were placed in this encounter.   38 year old female with chondromalacia of the patella with normal alignment  We discussed that there is no real definitive good treatment for this  Managing the symptoms is really the treatment including patient education  Oral supplements and physical therapy  Recheck in 3 months   Subjective: Chief Complaint  Patient presents with   Knee Pain    both    HPI: 38 year old female everyday smoker, history of anxiety substance abuse in remission prior admission to mental health facility presents with bilateral knee crepitance and bilateral knee pain with no history of trauma.  She does endorse attempts at pain relief by taking Tylenol  and Advil               ROS: Negative, pain is located medial along the pes tendons as well as generally around the entire knee with increased pain climbing stairs   Images personally read and my interpretation : Outside images AP lateral right and left knee no evidence of fracture dislocation or malalignment.  Dated May 29, 2024  Visit Diagnoses:  1. Chondromalacia patellae, left knee   2. Bilateral chronic knee pain   3. Chondromalacia patellae, right knee      Follow-Up Instructions: Return in about 3 months (around 09/22/2024) for FOLLOW UP, LEFT, RIGHT, KNEE.    Objective: Vital Signs: BP 134/87 Comment: 05/28/24  Ht 5' 6 (1.676 m)   Wt 115 lb (52.2 kg)   BMI 18.56 kg/m   Physical Exam General: Normally developed female normal foot alignment pretty good  arches increased arch with tiptoe standing oriented x 3 mood pleasant affect flat gait and station normal valgus alignment to the knees  Normal rotation of both hips without pain tenderness along the medial soft tissue sleeve crepitance on range of motion tenderness on the undersurface of both medial facets full range of motion knee feels completely stable on both sides muscle tone is normal skin is intact pulse and perfusion normal without edema no sensory abnormalities  The kneecaps track laterally when going from flexion to extension   Specialty Comments:  No specialty comments available.  Imaging: DG Knee 1-2 Views Right Result Date: 06/22/2024 Right knee 1 view Axial view patella The patient's initial gentle x-rays did not show the axial view of the patella and she has patellofemoral pain normal alignment no tilt or subluxation Normal patellar view   DG Knee 1-2 Views Left Result Date: 06/22/2024 The patient has patellofemoral pain original x-rays do not image the patellofemoral joint in an axial manner axial view of the patella left knee Normal alignment no tilt or subluxation dominant lateral facet     PMFS History: Patient Active Problem List   Diagnosis Date Noted   Generalized anxiety disorder 05/28/2024   Moderate episode of recurrent major depressive disorder (HCC) 05/28/2024   Substance abuse in remission (HCC) 05/28/2024   Crepitus of both knee joints 05/28/2024  Body mass index (BMI) less than 19 in adult 05/28/2024   Anxiety 03/09/2021   Past Medical History:  Diagnosis Date   Anxiety    Depression    Substance abuse (HCC)    alcohol abuse    Family History  Problem Relation Age of Onset   Hypertension Mother    Depression Mother    Anxiety disorder Mother    COPD Father    Cancer Father 7 - 79       stomach cancer   ADD / ADHD Son    Hyperlipidemia Maternal Grandmother    Colon cancer Neg Hx     History reviewed. No pertinent surgical  history. Social History   Occupational History   Not on file  Tobacco Use   Smoking status: Every Day    Current packs/day: 0.50    Types: Cigarettes   Smokeless tobacco: Never  Vaping Use   Vaping status: Never Used  Substance and Sexual Activity   Alcohol use: Not Currently    Comment: 1/2 to 1 gal whiskey per week. Stopped in June 2022   Drug use: Yes    Types: Marijuana    Comment: Marijuana 3-4 times a week. Previously used pain pills.   Sexual activity: Yes    Birth control/protection: I.U.D.

## 2024-06-22 NOTE — Progress Notes (Signed)
  Intake history:  Chief Complaint  Patient presents with   Knee Pain    both     BP 134/87 Comment: 05/28/24  Ht 5' 6 (1.676 m)   Wt 115 lb (52.2 kg)   BMI 18.56 kg/m  Body mass index is 18.56 kg/m.  Pharmacy? ____[facility] __________________________________  WHAT ARE WE SEEING YOU FOR TODAY?   Both knees  How long has this bothered you? (DOI?DOS?WS?)  approximately 3 years(s) ago  Was there an injury? No  Anticoag.  No   Any ALLERGIES ____________No Known Allergies __________________________________   Treatment:  Have you taken:  Tylenol  Yes  Advil  Yes  Had PT No  Had injection No  Other  _________________________

## 2024-07-05 ENCOUNTER — Encounter: Payer: Self-pay | Admitting: Physical Therapy

## 2024-07-05 ENCOUNTER — Ambulatory Visit: Admitting: Physical Therapy

## 2024-07-05 DIAGNOSIS — M25561 Pain in right knee: Secondary | ICD-10-CM | POA: Diagnosis not present

## 2024-07-05 DIAGNOSIS — M25562 Pain in left knee: Secondary | ICD-10-CM | POA: Diagnosis not present

## 2024-07-05 DIAGNOSIS — G8929 Other chronic pain: Secondary | ICD-10-CM | POA: Diagnosis not present

## 2024-07-05 NOTE — Therapy (Signed)
 OUTPATIENT PHYSICAL THERAPY LOWER EXTREMITY EVALUATION   Patient Name: Lisa Pierce MRN: [id number] DOB:1986-04-29, 38 y.o., female Today's Date: 07/05/2024  END OF SESSION:  PT End of Session - 07/05/24 1833     Visit Number 1    Number of Visits 4    Date for Recertification  08/02/24    PT Start Time 0317    PT Stop Time 0351    PT Time Calculation (min) 34 min    Activity Tolerance Patient tolerated treatment well    Behavior During Therapy [facility] for tasks assessed/performed          Past Medical History:  Diagnosis Date   Anxiety    Depression    Substance abuse (HCC)    alcohol abuse   History reviewed. No pertinent surgical history. Patient Active Problem List   Diagnosis Date Noted   Generalized anxiety disorder 05/28/2024   Moderate episode of recurrent major depressive disorder (HCC) 05/28/2024   Substance abuse in remission (HCC) 05/28/2024   Crepitus of both knee joints 05/28/2024   Body mass index (BMI) less than 19 in adult 05/28/2024   Anxiety 03/09/2021    REFERRING PROVIDER: Taft Minerva MD  REFERRING DIAG: Bilateral chronic knee pain.  THERAPY DIAG:  Chronic pain of right knee  Chronic pain of left knee  Rationale for Evaluation and Treatment: Rehabilitation  ONSET DATE: 2022.  SUBJECTIVE:   SUBJECTIVE STATEMENT: The patient presents to the clinic with c/o bilateral knee pain that has been ongoing and worsening over the last three years.  She rates her pain at a 4/10 today but can rise to much higher levels with certain activities (ie: squatting).  She has not found anything really decreases her pain.    PERTINENT HISTORY: See above.   PAIN:  Are you having pain? Yes: NPRS scale: 4/10.   Pain location: Bilateral knees.   Pain description: Ache. Aggravating factors: Squatting.   Relieving factors: Nothing.    PRECAUTIONS: Other: No pain increase therex.    RED FLAGS: None   WEIGHT BEARING RESTRICTIONS: No  FALLS:  Has  patient fallen in last 6 months? 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    PLOF: Independent  PATIENT GOALS: Decrease her knee pain for daily activities.     OBJECTIVE:   PATIENT SURVEYS:  LEFS:  51/80.    EDEMA:  No edema noted.  PALPATION: No specific areas of palpable pain.  Patient in knee/under kneecaps.    LOWER EXTREMITY ROM:  WNL.  Active bilateral knee flexion exhibited a significant amount of crepitus bilaterally and especially on the left.    LOWER EXTREMITY MMT:  Patient able to provide a normal bilateral hip and knee strength for bilateral hips and knees via manual muscle testing.       LOWER EXTREMITY SPECIAL TESTS:  Stable knees via special testing.   GAIT: The patient gait appears cautious in nature.  TREATMENT DATE: 07/05/24:  Discussed in detail with patient activity modification to avoid activities that increases her knee pain such as stairs (perform in a non-reciprocating fashion), avoid squats and use armrest on chairs, avoid sustained flexed knee posture and therex exercise should be performed without increasing pain.  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  She performed the recumbent bike today x 5 minutes and level 2 without complaint.  We discussed that this would likely be an excellent exercise for her.  Established a HEP with patient performing SDLY hip abduction, clamshell, LAQ's, wall slides and TKE's with ball behind knee of wall.      PATIENT EDUCATION:  Education details: See below.   Person educated: Patient Education method: Programmer, Multimedia, Demonstration, Verbal cues, and Handouts Education comprehension:  verbalized understanding and returned demonstration  HOME EXERCISE PROGRAM: HOME EXERCISE PROGRAM [[id number]]  Eccentric Long Arc Quad -  Repeat 20 Repetitions, Hold 2 Seconds, Complete 2 Sets, Perform 2 Times a Day  PEANUT BALL - SHORT ARC QUAD  - SAQ  -  Repeat 20 Repetitions, Hold 3 Seconds, Complete 2 Sets, Perform 2 Times a Day  SIDELYING CLAMSHELL - CLAM SHELL -  Repeat 20 Repetitions, Hold 2 Seconds, Complete 2 Sets, Perform 3 Times a Day  SUPINE HIP ABDUCTION - ELASTIC BAND CLAMS - CLAMSHELL -  Repeat 20 Repetitions, Hold 3 Seconds, Complete 2 Sets, Perform 2 Times a Day   ELASTIC BAND - HIP ABDUCTION - SEATED CLAMS -  Repeat 20 Repetitions, Hold 1 Second(s), Complete 2 Sets, Perform 2 Times a Day  HIP ABDUCTION KNEE TO CHEST - SIDELYING  -  Repeat 15 Repetitions, Hold 1 Second(s), Complete 2 Sets, Perform 3 Times a Day  Standing Wall squats -  Repeat 15 Repetitions, Hold 2 Seconds, Complete 2 Sets, Perform 2 Times a Day  BALL TKE - TERMINAL KNEE EXTENSION -  Repeat 1 Repetition, Hold 1 Second(s), Complete 1 Set, Perform 1 Times a Day  ASSESSMENT:  CLINICAL IMPRESSION: The patient presents to OPPT with c/o chronic knee pain.  She has significant crepitus, left > right.  She exhibits full active range of motion and can provide a normal strength grade via manual muscle testing.  Her knees are stable.  Her pain rises to high levels with certain activities.  We discussed the importance of activity modification and avoidance of increasing pain.  She has access to exercise equipment and recommended she use a recumbent bike as she did well with it today.  Patient plans of limiting visits.    OBJECTIVE IMPAIRMENTS: Abnormal gait, decreased activity tolerance, and pain.   ACTIVITY LIMITATIONS: carrying, lifting, bending, sitting, standing, squatting, stairs, and locomotion level  PARTICIPATION LIMITATIONS: meal prep, cleaning, laundry, community activity, and occupation  PERSONAL  FACTORS: Time since onset of injury/illness/exacerbation are also affecting patient's functional outcome.   REHAB POTENTIAL: Good  CLINICAL DECISION MAKING: Evolving/moderate complexity  EVALUATION COMPLEXITY: Low   GOALS:  SHORT TERM GOALS: Target date: 08/02/24.  Will update goals should patient plan to continue PT.  Ind with a HEP. Goal status: INITIAL    PLAN:  PT FREQUENCY/DURATION:  4 visits.    PLANNED INTERVENTIONS: 97110-Therapeutic exercises, 97530- Therapeutic activity, [id number]- Neuromuscular re-education, 97535- Self Care, [id number]- Manual therapy, and Patient/Family education  PLAN FOR NEXT SESSION: No pain increase therex (O and CKC).     Teyonna Plaisted, PT 07/05/2024, 7:08 PM

## 2024-07-09 ENCOUNTER — Encounter: Payer: Self-pay | Admitting: Family Medicine

## 2024-07-09 ENCOUNTER — Ambulatory Visit: Payer: Self-pay | Admitting: Family Medicine

## 2024-07-09 VITALS — BP 134/87 | HR 76 | Temp 98.7°F | Ht 66.0 in | Wt 117.4 lb

## 2024-07-09 DIAGNOSIS — F411 Generalized anxiety disorder: Secondary | ICD-10-CM

## 2024-07-09 DIAGNOSIS — F331 Major depressive disorder, recurrent, moderate: Secondary | ICD-10-CM

## 2024-07-09 NOTE — Patient Instructions (Signed)
 [facility]

## 2024-07-09 NOTE — Progress Notes (Signed)
 Established Patient Office Visit  Subjective   Patient ID: Lisa Pierce, female    DOB: [birth year]  Age: 38 y.o. MRN: [id number]  Chief Complaint  Patient presents with   Medical Management of Chronic Issues    HPI  History of Present Illness   Lisa Pierce is a 38 year old female who presents for follow-up after starting Pristiq  for depression.  Depressive symptoms and antidepressant therapy - Significant improvement in mood since starting Pristiq . - Continues to experience low mood several days per week, but symptoms are manageable and less disruptive to daily life. - Initial nausea with Pristiq  has resolved. - No other side effects from Pristiq . - Improvement in demeanor noted by employer as well - She feels much better and has far less anxiety - Doesn't desire a change in regimen currently  Knee pain and physical therapy - Evaluated by orthopedic specialist for knee pain. - Attended one session of physical therapy. - Believes ongoing physical therapy may be beneficial if regimen is maintained.         07/09/2024   10:45 AM 05/28/2024    1:42 PM 12/12/2023    3:39 PM  Depression screen PHQ 2/9  Decreased Interest 1 2 1   Down, Depressed, Hopeless 1 2 0  PHQ - 2 Score 2 4 1   Altered sleeping 1 1 0  Tired, decreased energy 1 2 1   Change in appetite 2 2 0  Feeling bad or failure about yourself  0 1 1  Trouble concentrating 1 1 0  Moving slowly or fidgety/restless 0 1 0  Suicidal thoughts 0 0 0  PHQ-9 Score 7 12  3    Difficult doing work/chores Somewhat difficult Somewhat difficult Not difficult at all     Data saved with a previous flowsheet row definition      07/09/2024   10:46 AM 05/28/2024    1:43 PM 12/12/2023    3:40 PM 03/09/2021    9:54 AM  GAD 7 : Generalized Anxiety Score  Nervous, Anxious, on Edge 1 3 3 2   Control/stop worrying 0 3 3 3   Worry too much - different things 0 3 3 2   Trouble relaxing 1 2 1 2   Restless 0 1 0 1  Easily annoyed or  irritable 1 3 2 3   Afraid - awful might happen 1 2 2 1   Total GAD 7 Score 4 17 14 14   Anxiety Difficulty Somewhat difficult Very difficult Somewhat difficult Somewhat difficult       ROS As per HPI.    Objective:     BP 134/87   Pulse 76   Temp 98.7 F (37.1 C) (Temporal)   Ht 5' 6 (1.676 m)   Wt 117 lb 6.4 oz (53.3 kg)   SpO2 100%   BMI 18.95 kg/m  Wt Readings from Last 3 Encounters:  07/09/24 117 lb 6.4 oz (53.3 kg)  06/22/24 115 lb (52.2 kg)  05/28/24 115 lb (52.2 kg)      Physical Exam Vitals and nursing note reviewed.  Constitutional:      General: She is not in acute distress.    Appearance: Normal appearance. She is not ill-appearing, toxic-appearing or diaphoretic.  Cardiovascular:     Rate and Rhythm: Normal rate and regular rhythm.     Pulses: Normal pulses.     Heart sounds: Normal heart sounds. No murmur heard. Pulmonary:     Effort: Pulmonary effort is normal. No respiratory distress.     Breath  sounds: Normal breath sounds.  Musculoskeletal:     Right lower leg: No edema.     Left lower leg: No edema.  Skin:    General: Skin is warm and dry.  Neurological:     General: No focal deficit present.     Mental Status: She is alert and oriented to person, place, and time.  Psychiatric:        Mood and Affect: Mood normal.        Behavior: Behavior normal.      No results found for any visits on 07/09/24.    The ASCVD Risk score (Arnett DK, et al., 2019) failed to calculate for the following reasons:   The 2019 ASCVD risk score is only valid for ages 1 to 25    Assessment & Plan:   Lisa Pierce was seen today for medical management of chronic issues.  Diagnoses and all orders for this visit:  Generalized anxiety disorder  Moderate episode of recurrent major depressive disorder (HCC)   Assessment and Plan    Major depressive disorder, recurrent, moderate Generalized anxiety disorder Significant improvement with Pristiq . Some  manageable depressive symptoms persist. No significant side effects. - Continue Pristiq  as prescribed. - Follow-up in three months to assess response. - Report any significant symptom increase or side effects. - Encouraged meditation and lifestyle modifications.       Return in about 3 months (around 10/07/2024) for medication follow up.   The patient indicates understanding of these issues and agrees with the plan.  Lisa Pierce CHRISTELLA Search, FNP

## 2024-09-21 ENCOUNTER — Ambulatory Visit: Admitting: Orthopedic Surgery

## 2024-10-10 ENCOUNTER — Ambulatory Visit: Admitting: Family Medicine
